# Patient Record
Sex: Male | Born: 1955 | Race: Black or African American | Hispanic: No | Marital: Married | Smoking: Never smoker
Health system: Southern US, Community
[De-identification: ages and names within clinical notes are randomized; demographics above are authoritative.]

## PROBLEM LIST (undated history)

## (undated) DIAGNOSIS — K648 Other hemorrhoids: Secondary | ICD-10-CM

## (undated) DIAGNOSIS — K635 Polyp of colon: Secondary | ICD-10-CM

## (undated) DIAGNOSIS — E785 Hyperlipidemia, unspecified: Secondary | ICD-10-CM

## (undated) DIAGNOSIS — K644 Residual hemorrhoidal skin tags: Secondary | ICD-10-CM

## (undated) DIAGNOSIS — T7840XA Allergy, unspecified, initial encounter: Secondary | ICD-10-CM

## (undated) HISTORY — DX: Allergy, unspecified, initial encounter: T78.40XA

## (undated) HISTORY — PX: BUNIONECTOMY: SHX129

## (undated) HISTORY — DX: Polyp of colon: K63.5

## (undated) HISTORY — DX: Hyperlipidemia, unspecified: E78.5

## (undated) HISTORY — DX: Other hemorrhoids: K64.8

## (undated) HISTORY — DX: Residual hemorrhoidal skin tags: K64.4

## (undated) SURGERY — EXAM UNDER ANESTHESIA WITH HEMORRHOIDECTOMY
Anesthesia: General

---

## 1999-03-22 ENCOUNTER — Ambulatory Visit (HOSPITAL_COMMUNITY): Admission: RE | Admit: 1999-03-22 | Discharge: 1999-03-22 | Payer: Self-pay | Admitting: Gastroenterology

## 2002-04-02 ENCOUNTER — Ambulatory Visit (HOSPITAL_COMMUNITY): Admission: RE | Admit: 2002-04-02 | Discharge: 2002-04-02 | Payer: Self-pay | Admitting: *Deleted

## 2002-04-02 ENCOUNTER — Encounter (INDEPENDENT_AMBULATORY_CARE_PROVIDER_SITE_OTHER): Payer: Self-pay | Admitting: Specialist

## 2004-01-14 ENCOUNTER — Ambulatory Visit (HOSPITAL_COMMUNITY): Admission: RE | Admit: 2004-01-14 | Discharge: 2004-01-14 | Payer: Self-pay | Admitting: Family Medicine

## 2004-05-05 ENCOUNTER — Ambulatory Visit (HOSPITAL_COMMUNITY): Admission: RE | Admit: 2004-05-05 | Discharge: 2004-05-05 | Payer: Self-pay | Admitting: *Deleted

## 2007-11-02 DIAGNOSIS — K635 Polyp of colon: Secondary | ICD-10-CM

## 2007-11-02 HISTORY — DX: Polyp of colon: K63.5

## 2008-09-03 HISTORY — PX: COLONOSCOPY W/ POLYPECTOMY: SHX1380

## 2009-09-30 ENCOUNTER — Encounter: Admission: RE | Admit: 2009-09-30 | Discharge: 2009-09-30 | Payer: Self-pay | Admitting: Emergency Medicine

## 2011-01-18 IMAGING — CT CT CHEST W/ CM
2 of 4 series · 15 of 36 positions shown, 18 images · IV contrast (CONTRAST)
Comparison: None

CLINICAL DATA: Chest pain, left lower rib pain and left flank pain

CT CHEST WITH CONTRAST
TECHNIQUE: Multidetector CT imaging of the chest was performed
following the standard protocol during bolus administration of
intravenous contrast.
Contrast: 80 ml Dmnipaque-ENN

[Series 2: w/ · axial · 0.79mm/px · z∈[+888,+1163]mm · 12 of 66 slices shown, 15 images]
[im 6/66  mediastinal]
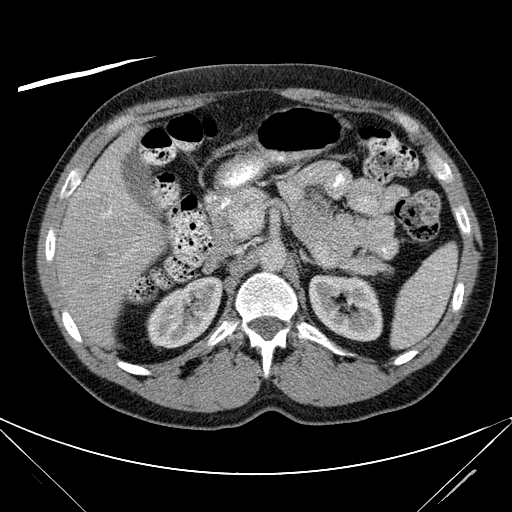
[im 6/66  lung]
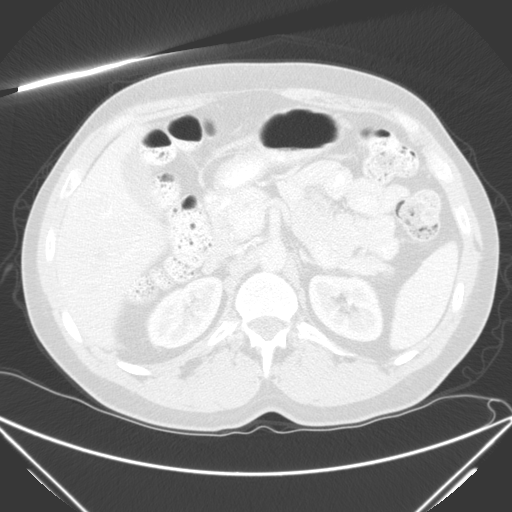
[im 11/66  lung]
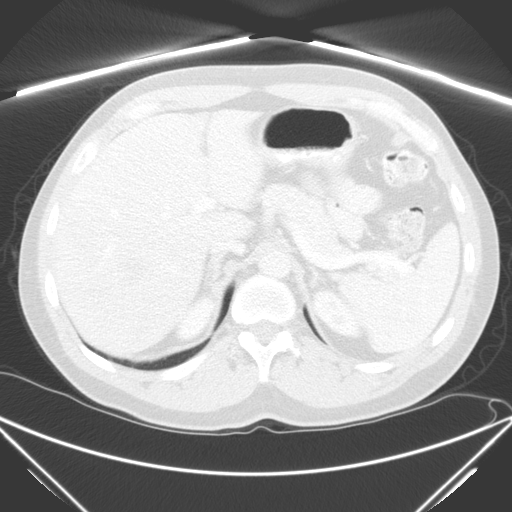
[im 16/66  lung]
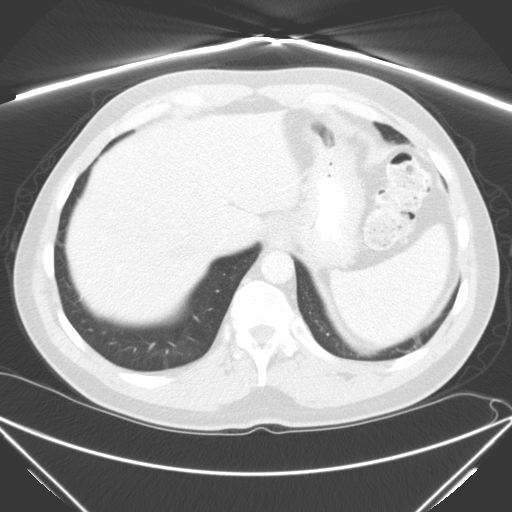
[im 21/66  lung]
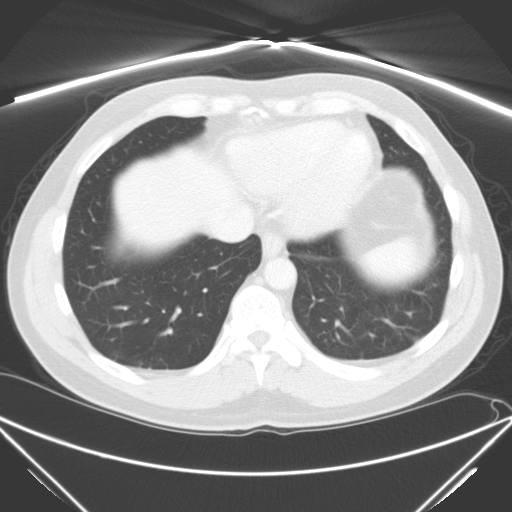
[im 26/66  mediastinal]
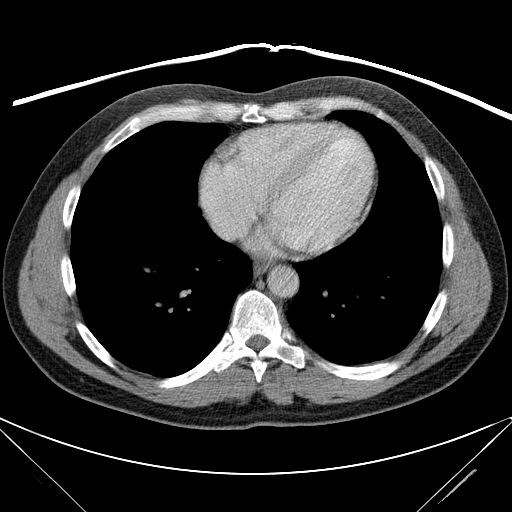
[im 26/66  lung]
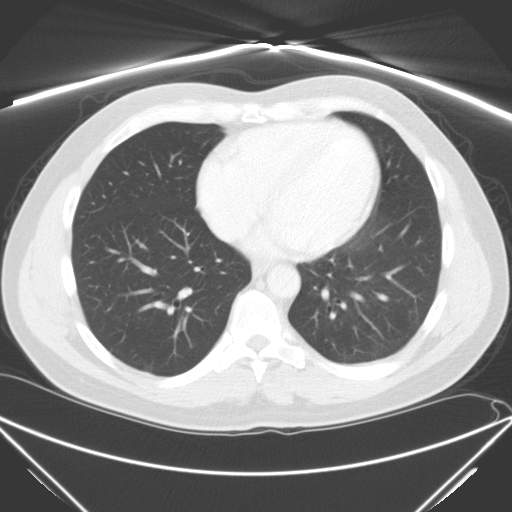
[im 31/66  lung]
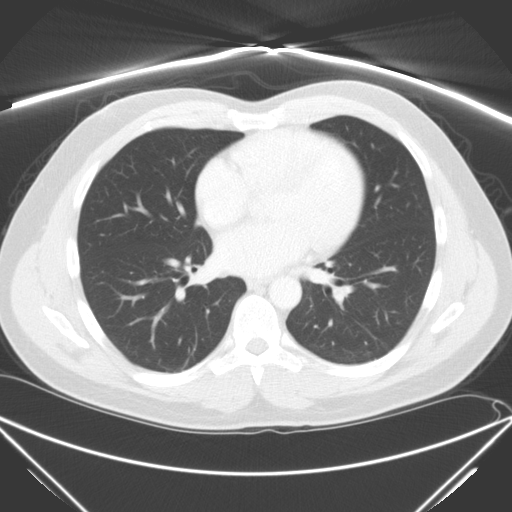
[im 36/66  lung]
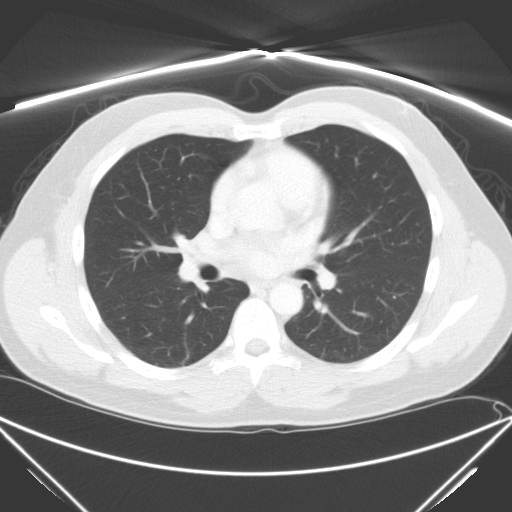
[im 41/66  lung]
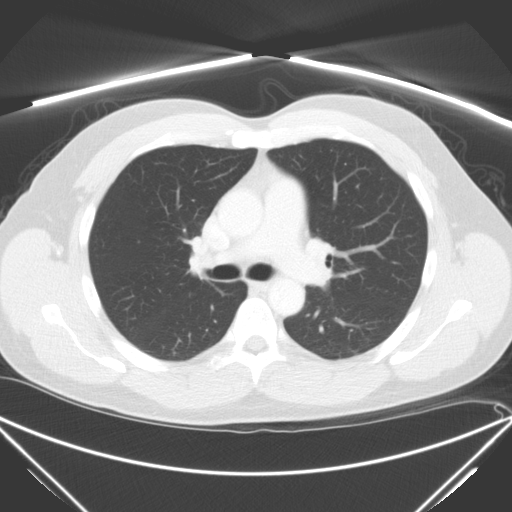
[im 46/66  mediastinal]
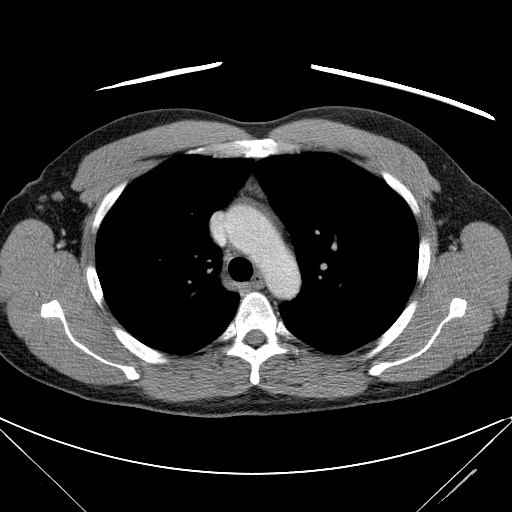
[im 46/66  lung]
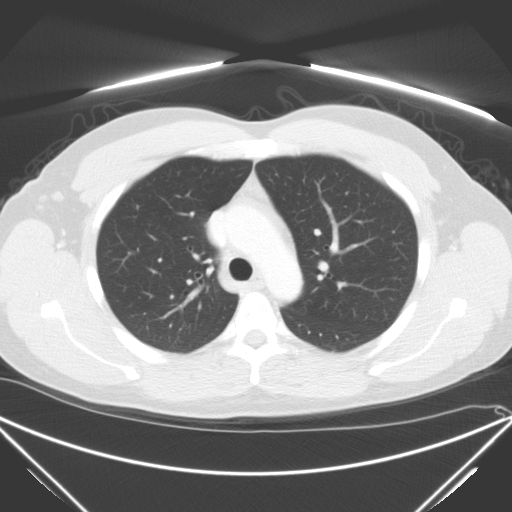
[im 51/66  lung]
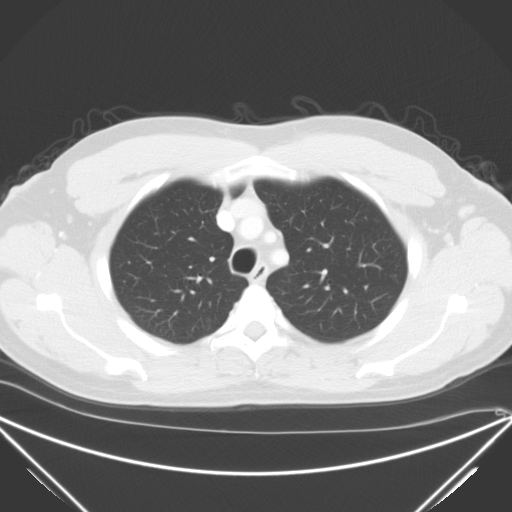
[im 56/66  lung]
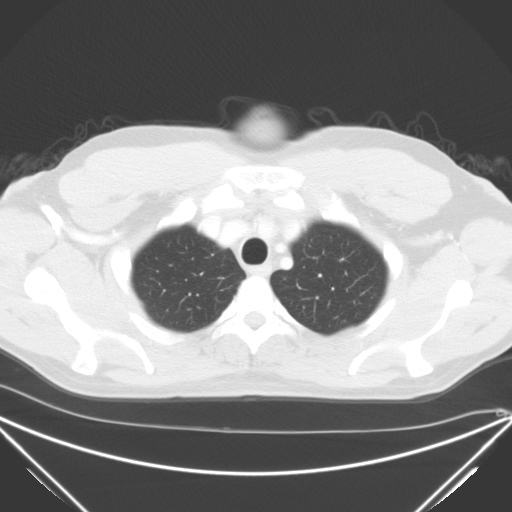
[im 61/66  lung]
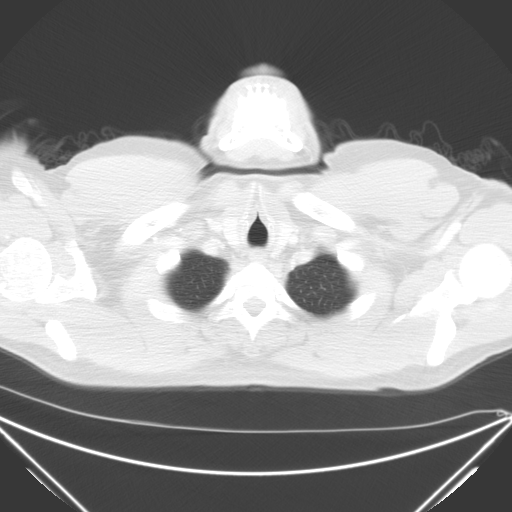

[cor · coronal · 0.79mm/px · 3 of 92 slices shown]
[im 19/92  lung]
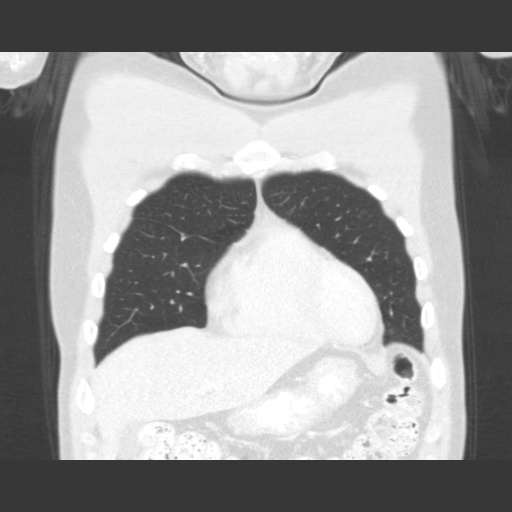
[im 37/92  lung]
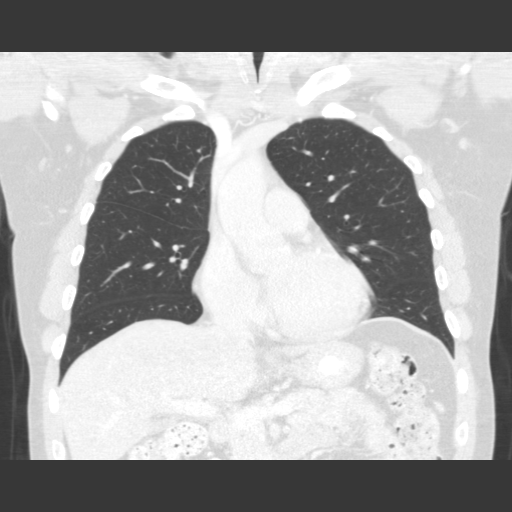
[im 55/92  lung]
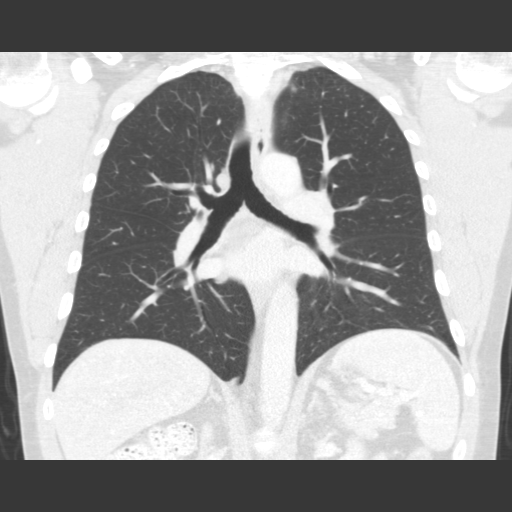

[15 of 36 positions shown; findings below may reference images not displayed]

FINDINGS: There is a poorly defined nodular opacity  of only 3 mm
within the anterior right upper lobe, with two additional 3 mm
noncalcified nodules in the superior segment of the left lower lobe
as well. These nodules are nonspecific and may be due to prior
granulomatous disease.  However an inflammatory process or
metastatic disease is difficult to exclude on the current study and
follow-up CT guidelines are given below. If the patient is at high
risk for bronchogenic carcinoma, follow-up chest CT at 1 year is
recommended.  If the patient is at low risk, no follow-up is
needed.  This recommendation follows the consensus statement:
"Guidelines for Management of Small Pulmonary Nodules Detected on
CT Scans:  A Statement from the [HOSPITAL]" as published in
[URL] No pleural
effusion is seen.

On soft tissue window images, the thyroid gland is normal in size.
The origins of the great vessels are patent.  The pulmonary
arteries and thoracic aorta opacify with no significant abnormality
noted.  No mediastinal or hilar adenopathy is seen.  No abnormality
of the ribs is seen.  The portion of the upper abdomen that is
visualized appears unremarkable.
IMPRESSION: 1.  The only abnormality noted are three noncalcified 3 mm lung
nodules as described above.  These are nonspecific but follow-up
recommendations are given above.
2.  No rib abnormality is seen.
3.  No abnormality is noted within the upper abdomen on the images
obtained.

## 2011-09-23 ENCOUNTER — Ambulatory Visit (INDEPENDENT_AMBULATORY_CARE_PROVIDER_SITE_OTHER): Payer: BC Managed Care – PPO

## 2011-09-23 DIAGNOSIS — E7800 Pure hypercholesterolemia, unspecified: Secondary | ICD-10-CM

## 2011-09-23 DIAGNOSIS — M546 Pain in thoracic spine: Secondary | ICD-10-CM

## 2011-09-23 DIAGNOSIS — K219 Gastro-esophageal reflux disease without esophagitis: Secondary | ICD-10-CM

## 2011-10-26 ENCOUNTER — Telehealth: Payer: Self-pay

## 2011-10-26 DIAGNOSIS — E785 Hyperlipidemia, unspecified: Secondary | ICD-10-CM

## 2011-10-26 NOTE — Telephone Encounter (Signed)
.  [facility] PT STATES HE WAS TO CALL BACK IF HE DECIDED TO START TAKING CHOL MEDICINE AND HE HAVE AGREED TO DO SO.  PLEASE CALL PT AT [phone]  [facility] ON [address] IN [place]

## 2011-10-30 MED ORDER — ATORVASTATIN CALCIUM 10 MG PO TABS: 10.0000 mg | ORAL_TABLET | Freq: Every day | ORAL | Status: DC

## 2011-10-30 NOTE — Telephone Encounter (Signed)
Printed ? Where to send  F/u as planned 03/2012

## 2011-10-30 NOTE — Telephone Encounter (Signed)
Chart pulled to clinical TL 

## 2011-10-30 NOTE — Telephone Encounter (Signed)
lmom to let pt to know that we will sent request to dr. Chart is in dr [employer]

## 2012-02-24 ENCOUNTER — Ambulatory Visit (INDEPENDENT_AMBULATORY_CARE_PROVIDER_SITE_OTHER): Payer: BC Managed Care – PPO | Admitting: Emergency Medicine

## 2012-02-24 VITALS — BP 123/77 | HR 59 | Temp 98.2°F | Resp 16 | Ht 70.0 in | Wt 208.0 lb

## 2012-02-24 DIAGNOSIS — E782 Mixed hyperlipidemia: Secondary | ICD-10-CM

## 2012-02-24 MED ORDER — CELECOXIB 200 MG PO CAPS: 200.0000 mg | ORAL_CAPSULE | Freq: Every morning | ORAL | Status: AC

## 2012-02-24 NOTE — Progress Notes (Signed)
  Subjective:    Patient ID: Steve Graham, male    DOB: 08/26/1956, 56 y.o.   MRN: [id number]  Hyperlipidemia This is a recurrent problem. The current episode started more than 1 year ago. The problem is uncontrolled. He has no history of chronic renal disease, diabetes, hypothyroidism, liver disease, obesity or nephrotic syndrome. There are no known factors aggravating his hyperlipidemia. Associated symptoms include myalgias. Pertinent negatives include no chest pain, focal sensory loss, focal weakness, leg pain or shortness of breath. Current antihyperlipidemic treatment includes exercise and diet change. The current treatment provides no improvement of lipids. There are no compliance problems.  Risk factors for coronary artery disease include male sex, a sedentary lifestyle, dyslipidemia and family history.      Review of Systems  Constitutional: Negative.   HENT: Negative.   Eyes: Negative.   Respiratory: Negative for shortness of breath.   Cardiovascular: Negative for chest pain.  Gastrointestinal: Negative.   Genitourinary: Negative.   Musculoskeletal: Positive for myalgias and arthralgias.  Neurological: Negative.  Negative for focal weakness.  Hematological: Negative.        Objective:   Physical Exam  Constitutional: He is oriented to person, place, and time. He appears well-developed and well-nourished.  HENT:  Head: Normocephalic and atraumatic.  Mouth/Throat: Oropharynx is clear and moist.  Eyes: Conjunctivae are normal. Pupils are equal, round, and reactive to light.  Neck: Normal range of motion. Neck supple.  Cardiovascular: Normal rate and regular rhythm.   Pulmonary/Chest: Effort normal and breath sounds normal.  Abdominal: Soft.  Musculoskeletal: Normal range of motion.  Neurological: He is alert and oriented to person, place, and time.  Skin: Skin is warm and dry.          Assessment & Plan:  Vague joint pains not on cholesterol therapy  Not  fasting Back nest week for labs Start celebrex for jont pains

## 2012-02-29 ENCOUNTER — Ambulatory Visit (INDEPENDENT_AMBULATORY_CARE_PROVIDER_SITE_OTHER): Payer: BC Managed Care – PPO | Admitting: Emergency Medicine

## 2012-02-29 VITALS — BP 132/79 | HR 59 | Temp 98.2°F | Resp 16 | Ht 70.0 in | Wt 208.6 lb

## 2012-02-29 DIAGNOSIS — E782 Mixed hyperlipidemia: Secondary | ICD-10-CM

## 2012-02-29 NOTE — Progress Notes (Signed)
  Subjective:    Patient ID: Steve Graham, male    DOB: 09-Mar-1956, 56 y.o.   MRN: [id number]  Hyperlipidemia This is a recurrent problem. The current episode started more than 1 year ago. The problem is uncontrolled. Recent lipid tests were reviewed and are high. He has no history of chronic renal disease, diabetes, hypothyroidism, liver disease, obesity or nephrotic syndrome. There are no known factors aggravating his hyperlipidemia. Pertinent negatives include no chest pain, focal sensory loss, focal weakness, leg pain, myalgias or shortness of breath. Current antihyperlipidemic treatment includes diet change. The current treatment provides no improvement of lipids. Compliance problems include adherence to diet.  Risk factors for coronary artery disease include dyslipidemia and male sex.      Review of Systems  Constitutional: Negative.   HENT: Negative.   Eyes: Negative.   Respiratory: Negative.  Negative for shortness of breath.   Cardiovascular: Negative for chest pain.  Gastrointestinal: Negative.   Genitourinary: Negative.   Musculoskeletal: Negative.  Negative for myalgias.  Neurological: Negative for focal weakness.       Objective:   Physical Exam  Constitutional: He is oriented to person, place, and time. He appears well-developed and well-nourished.  HENT:  Head: Normocephalic and atraumatic.  Eyes: Conjunctivae are normal. Pupils are equal, round, and reactive to light.  Neck: Normal range of motion.  Cardiovascular: Normal rate, regular rhythm and normal heart sounds.   Pulmonary/Chest: Effort normal and breath sounds normal.  Abdominal: Soft.  Musculoskeletal: Normal range of motion.  Neurological: He is alert and oriented to person, place, and time.  Skin: Skin is warm and dry.          Assessment & Plan:  Here for labs off medications

## 2012-03-01 LAB — COMPREHENSIVE METABOLIC PANEL
ALT: 44 U/L (ref 0–53)
AST: 27 U/L (ref 0–37)
CO2: 25 mEq/L (ref 19–32)
Calcium: 9.3 mg/dL (ref 8.4–10.5)
Glucose, Bld: 86 mg/dL (ref 70–99)
Potassium: 4.6 mEq/L (ref 3.5–5.3)
Sodium: 140 mEq/L (ref 135–145)

## 2012-03-01 LAB — LIPID PANEL
Cholesterol: 205 mg/dL — ABNORMAL HIGH (ref 0–200)
HDL: 44 mg/dL (ref >39)
LDL Cholesterol: 151 mg/dL — ABNORMAL HIGH (ref 0–99)
Triglycerides: 52 mg/dL (ref <150)
VLDL: 10 mg/dL (ref 0–40)

## 2012-03-01 LAB — VITAMIN D 25 HYDROXY (VIT D DEFICIENCY, FRACTURES): Vit D, 25-Hydroxy: 29 ng/mL — ABNORMAL LOW (ref 30–89)

## 2012-03-01 LAB — TESTOSTERONE: Testosterone: 628.95 ng/dL (ref 300–890)

## 2012-03-05 ENCOUNTER — Encounter: Payer: Self-pay | Admitting: Emergency Medicine

## 2012-03-05 ENCOUNTER — Other Ambulatory Visit: Payer: Self-pay | Admitting: Emergency Medicine

## 2012-03-05 DIAGNOSIS — E785 Hyperlipidemia, unspecified: Secondary | ICD-10-CM

## 2012-03-05 MED ORDER — VITAMIN D (ERGOCALCIFEROL) 1.25 MG (50000 UNIT) PO CAPS: 50000.0000 [IU] | ORAL_CAPSULE | ORAL | Status: DC

## 2012-03-05 MED ORDER — ATORVASTATIN CALCIUM 10 MG PO TABS: 10.0000 mg | ORAL_TABLET | Freq: Every day | ORAL | Status: DC

## 2012-09-07 ENCOUNTER — Ambulatory Visit (INDEPENDENT_AMBULATORY_CARE_PROVIDER_SITE_OTHER): Payer: BC Managed Care – PPO | Admitting: Family Medicine

## 2012-09-07 VITALS — BP 115/70 | HR 80 | Temp 98.9°F | Resp 18 | Wt 212.0 lb

## 2012-09-07 DIAGNOSIS — Z0000 Encounter for general adult medical examination without abnormal findings: Secondary | ICD-10-CM

## 2012-09-07 DIAGNOSIS — Z23 Encounter for immunization: Secondary | ICD-10-CM

## 2012-09-07 LAB — COMPREHENSIVE METABOLIC PANEL
ALT: 46 U/L (ref 0–53)
AST: 21 U/L (ref 0–37)
Albumin: 4.6 g/dL (ref 3.5–5.2)
Alkaline Phosphatase: 50 U/L (ref 39–117)
CO2: 28 mEq/L (ref 19–32)
Glucose, Bld: 81 mg/dL (ref 70–99)
Potassium: 4.1 mEq/L (ref 3.5–5.3)
Sodium: 135 mEq/L (ref 135–145)

## 2012-09-07 LAB — LIPID PANEL
HDL: 46 mg/dL (ref >39)
LDL Cholesterol: 110 mg/dL — ABNORMAL HIGH (ref 0–99)
Total CHOL/HDL Ratio: 3.7 Ratio
Triglycerides: 65 mg/dL (ref <150)

## 2012-09-07 LAB — POCT UA - MICROSCOPIC ONLY
Casts, Ur, LPF, POC: NEGATIVE
Epithelial cells, urine per micros: NEGATIVE
Mucus, UA: NEGATIVE
Yeast, UA: NEGATIVE

## 2012-09-07 LAB — VITAMIN B12: Vitamin B-12: 1094 pg/mL — ABNORMAL HIGH (ref 211–911)

## 2012-09-07 LAB — POCT URINALYSIS DIPSTICK
Bilirubin, UA: NEGATIVE
Glucose, UA: NEGATIVE
Spec Grav, UA: >=1.03
pH, UA: 6

## 2012-09-07 LAB — POCT CBC
Granulocyte percent: 61.1 %G (ref 37–80)
HCT, POC: 45.2 % (ref 43.5–53.7)
Hemoglobin: 14.2 g/dL (ref 14.1–18.1)
MCV: 93.3 fL (ref 80–97)
POC LYMPH PERCENT: 32.4 %L (ref 10–50)
RBC: 4.84 M/uL (ref 4.69–6.13)

## 2012-09-07 LAB — TSH: TSH: 1.969 u[IU]/mL (ref 0.350–4.500)

## 2012-09-07 NOTE — Patient Instructions (Addendum)
1. Routine general medical examination at a health care facility  Comprehensive metabolic panel, Lipid panel, EKG 12-Lead, POCT CBC, POCT glycosylated hemoglobin (Hb A1C), Vitamin B12, TSH, POCT UA - Microscopic Only, POCT urinalysis dipstick, PSA, Vitamin D 25 hydroxy     RECOMMEND STARTING ASPIRIN 81 MG ONCE DAILY FOR HEART ATTACK AND STROKE PREVENTION.

## 2012-09-07 NOTE — Progress Notes (Signed)
[address]   [place], [place]  [address]   [phone]  Subjective:    Patient ID: Steve Graham, male    DOB: 1956/01/24, 57 y.o.   MRN: [id number]  HPIThis 57 y.o. male presents for evaluation for CPE.    Last physical 09/2011.   Tetanus vaccine 2004. Flu vaccines never.  Colonoscopy 2010; +polyps; repeat in 5 years. Eye exam 08/2012; no glaucoma or cataracts.  [facility] exam: four months ago.   Review of Systems  Constitutional: Negative for fever, chills, diaphoresis, activity change, appetite change, fatigue and unexpected weight change.  HENT: Negative for hearing loss, ear pain, nosebleeds, congestion, sore throat, facial swelling, rhinorrhea, sneezing, drooling, mouth sores, trouble swallowing, neck pain, neck stiffness, dental problem, voice change, postnasal drip, sinus pressure, tinnitus and ear discharge.   Eyes: Positive for discharge. Negative for photophobia, pain, redness, itching and visual disturbance.  Respiratory: Negative for apnea, cough, choking, chest tightness, shortness of breath, wheezing and stridor.   Cardiovascular: Negative for chest pain, palpitations and leg swelling.  Gastrointestinal: Negative for nausea, vomiting, abdominal pain, diarrhea, constipation, blood in stool, abdominal distention, anal bleeding and rectal pain.  Genitourinary: Negative for dysuria, urgency, frequency, hematuria, flank pain, decreased urine volume, discharge, penile swelling, scrotal swelling, enuresis, difficulty urinating, genital sores, penile pain and testicular pain.  Musculoskeletal: Positive for back pain. Negative for myalgias, joint swelling, arthralgias and gait problem.  Skin: Negative for color change, pallor, rash and wound.  Neurological: Negative for dizziness, tremors, seizures, syncope, facial asymmetry, speech difficulty, weakness, light-headedness, numbness and headaches.  Hematological: Negative for adenopathy. Does not bruise/bleed easily.    Psychiatric/Behavioral: Negative for suicidal ideas, hallucinations, behavioral problems, confusion, sleep disturbance, self-injury, dysphoric mood, decreased concentration and agitation. The patient is not nervous/anxious and is not hyperactive.         Past Medical History  Diagnosis Date  . Allergy   . Hyperlipidemia     Past Surgical History  Procedure Date  . Bunionectomy   . Colonoscopy w/ polypectomy 09/03/2008    colon polyps. repeat in 5 years. Nat Mann    Prior to Admission medications   Medication Sig Start Date End Date Taking? Authorizing Provider  aspirin 81 MG tablet Take 81 mg by mouth daily.   Yes Historical Provider, MD  atorvastatin (LIPITOR) 10 MG tablet Take 1 tablet (10 mg total) by mouth daily. 03/05/12 03/05/13 Yes Phillips Odor, MD  co-enzyme Q-10 30 MG capsule Take 30 mg by mouth 3 (three) times daily.   Yes Historical Provider, MD  cycloSPORINE (RESTASIS) 0.05 % ophthalmic emulsion 1 drop 2 (two) times daily.   Yes Historical Provider, MD  Multiple Vitamins-Minerals (MULTIVITAMIN WITH MINERALS) tablet Take 1 tablet by mouth daily.   Yes Historical Provider, MD  Vitamin D, Ergocalciferol, (DRISDOL) 50000 UNITS CAPS Take 1 capsule (50,000 Units total) by mouth every 7 (seven) days. 03/05/12  Yes Phillips Odor, MD    No Known Allergies  History   Social History  . Marital Status: Married    Spouse Name: N/A    Number of Children: N/A  . Years of Education: N/A   Occupational History  . Not on file.   Social History Main Topics  . Smoking status: Never Smoker   . Smokeless tobacco: Not on file  . Alcohol Use: Yes     Comment: 2 drinks per week  . Drug Use: No  . Sexually Active: Yes   Other Topics Concern  . Not on  file   Social History Narrative   Marital status: married x 23 years; happily married   Children:  2 boys; no grandchildren.   Lives: wife, 2 boys.   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     Education: college   Tobacco:  never   Alcohol: two drinks per week.   Drugs: none   Exercises:  Two times per week for 30 minutes running, weight lifting.  Seatbelt:  100%   Guns: none    Family History  Problem Relation Age of Onset  . Diabetes Mother   . Hypertension Mother   . Asthma Son    Ceasar Mons Vitals:   09/07/12 0933  BP: 115/70  Pulse: 80  Temp: 98.9 F (37.2 C)  Resp: 18   Objective:   Physical Exam  Nursing note and vitals reviewed. Constitutional: He is oriented to person, place, and time. He appears well-developed and well-nourished. No distress.  HENT:  Head: Normocephalic and atraumatic.  Right Ear: External ear normal.  Left Ear: External ear normal.  Nose: Nose normal.  Mouth/Throat: Oropharynx is clear and moist.  Eyes: Conjunctivae normal and EOM are normal. Pupils are equal, round, and reactive to light.  Neck: Normal range of motion. Neck supple. No JVD present. No thyromegaly present.  Cardiovascular: Normal rate, regular rhythm, normal heart sounds and intact distal pulses.  Exam reveals no gallop and no friction rub.   No murmur heard. Pulmonary/Chest: Effort normal and breath sounds normal. He has no wheezes. He has no rales.  Abdominal: Soft. Bowel sounds are normal. He exhibits no distension and no mass. There is no tenderness. There is no rebound and no guarding. Hernia confirmed negative in the right inguinal area and confirmed negative in the left inguinal area.  Genitourinary: Prostate normal, testes normal and penis normal. Rectal exam shows external hemorrhoid. Rectal exam shows no fissure, no mass, no tenderness and anal tone normal. Right testis shows no mass, no swelling and no tenderness. Left testis shows no mass, no swelling and no tenderness. No penile tenderness.  Musculoskeletal:       Right shoulder: Normal.       Left shoulder: Normal.       Cervical back: Normal.       Thoracic back: Normal.       Lumbar back: Normal.  Lymphadenopathy:    He has no cervical  adenopathy.       Right: No inguinal adenopathy present.       Left: No inguinal adenopathy present.  Neurological: He is alert and oriented to person, place, and time. He has normal reflexes. No cranial nerve deficit. He exhibits normal muscle tone. Coordination normal.  Skin: Skin is warm and dry. No rash noted. He is not diaphoretic.  Psychiatric: He has a normal mood and affect. His behavior is normal. Judgment and thought content normal.      Results for orders placed in visit on 09/07/12  POCT CBC      Component Value Range   WBC 5.6  4.6 - 10.2 K/uL   Lymph, poc 1.8  0.6 - 3.4   POC LYMPH PERCENT 32.4  10 - 50 %L   MID (cbc) 0.4  0 - 0.9   POC MID % 6.5  0 - 12 %M   POC Granulocyte 3.4  2 - 6.9   Granulocyte percent 61.1  37 - 80 %G   RBC 4.84  4.69 - 6.13 M/uL   Hemoglobin 14.2  14.1 - 18.1  g/dL   HCT, POC 40.9  81.1 - 53.7 %   MCV 93.3  80 - 97 fL   MCH, POC 29.3  27 - 31.2 pg   MCHC 31.4 (*) 31.8 - 35.4 g/dL   RDW, POC 91.4     Platelet Count, POC 235  142 - 424 K/uL   MPV 9.4  0 - 99.8 fL  POCT GLYCOSYLATED HEMOGLOBIN (HGB A1C)      Component Value Range   Hemoglobin A1C 5.4    POCT UA - MICROSCOPIC ONLY      Component Value Range   WBC, Ur, HPF, POC 0-1     RBC, urine, microscopic 0-3     Bacteria, U Microscopic neg     Mucus, UA neg     Epithelial cells, urine per micros neg     Crystals, Ur, HPF, POC neg     Casts, Ur, LPF, POC neg     Yeast, UA neg    POCT URINALYSIS DIPSTICK      Component Value Range   Color, UA yellow     Clarity, UA clear     Glucose, UA neg     Bilirubin, UA neg     Ketones, UA neg     Spec Grav, UA >=1.030     Blood, UA trace     pH, UA 6.0     Protein, UA neg     Urobilinogen, UA 0.2     Nitrite, UA neg     Leukocytes, UA Negative     i Assessment & Plan:   1. Routine general medical examination at a health care facility  Comprehensive metabolic panel, Lipid panel, EKG 12-Lead, POCT CBC, POCT glycosylated hemoglobin (Hb  A1C), Vitamin B12, TSH, POCT UA - Microscopic Only, POCT urinalysis dipstick, PSA, Vitamin D 25 hydroxy    1.  CPE:  Anticipatory guidance --- weight maintenance, exercise, start ASA 81mg  daily.  Immunizations discussed; declined influenza vaccine; s/p TDAP.  Colonoscopy UTD.  Obtain labs.

## 2012-09-08 LAB — VITAMIN D 25 HYDROXY (VIT D DEFICIENCY, FRACTURES): Vit D, 25-Hydroxy: 44 ng/mL (ref 30–89)

## 2012-10-26 ENCOUNTER — Ambulatory Visit (INDEPENDENT_AMBULATORY_CARE_PROVIDER_SITE_OTHER): Payer: BC Managed Care – PPO | Admitting: Family Medicine

## 2012-10-26 VITALS — BP 142/72 | HR 88 | Temp 98.5°F | Resp 17 | Ht 70.0 in | Wt 211.0 lb

## 2012-10-26 DIAGNOSIS — K644 Residual hemorrhoidal skin tags: Secondary | ICD-10-CM

## 2012-10-26 LAB — POCT CBC
Granulocyte percent: 53 %G (ref 37–80)
HCT, POC: 38.6 % — AB (ref 43.5–53.7)
MCV: 93 fL (ref 80–97)
MID (cbc): 0.3 (ref 0–0.9)
POC Granulocyte: 2.5 (ref 2–6.9)
POC LYMPH PERCENT: 40.5 %L (ref 10–50)
Platelet Count, POC: 200 10*3/uL (ref 142–424)
RBC: 4.15 M/uL — AB (ref 4.69–6.13)
RDW, POC: 16.7 %

## 2012-10-26 MED ORDER — HYDROCORTISONE ACE-PRAMOXINE 2.5-1 % RE CREA: TOPICAL_CREAM | Freq: Four times a day (QID) | RECTAL | Status: DC

## 2012-10-26 NOTE — Patient Instructions (Signed)

## 2012-10-26 NOTE — Progress Notes (Signed)
Subjective:    Patient ID: Steve Graham, male    DOB: 07-01-1956, 57 y.o.   MRN: [id number] Chief Complaint  Patient presents with  . Hemorrhoids    bleeding     HPI  Has a h/o hemorroids and usually go away after a wk or so but this one has been flaired for about 2 wks and starts bleeding and he will have to pack self with tissue and can get it to stop after about 30 min.  Has been using preparation H, a tucs spray, and a new preparation type ointment.  Showers - no baths.  Bleeding is bright red. No constipation - takes gummer fiber pills so stools are soft and has been taking philips colon health.  Hemorrhoid is somewhat painful.  Is external.  Did have to have surgery once about 10 yrs ago.    Past Medical History  Diagnosis Date  . Allergy   . Hyperlipidemia   . Hemorrhoids, external   . Hemorrhoids, internal   . Colon polyp 11/02/2007    sessile polyp, external hemorrhoids.  Repeat in 5-10 years. Hung.   Current Outpatient Prescriptions on File Prior to Visit  Medication Sig Dispense Refill  . aspirin 81 MG tablet Take 81 mg by mouth daily.      Marland Kitchen atorvastatin (LIPITOR) 10 MG tablet Take 1 tablet (10 mg total) by mouth daily.  90 tablet  3  . cycloSPORINE (RESTASIS) 0.05 % ophthalmic emulsion 1 drop 2 (two) times daily.      . Multiple Vitamins-Minerals (MULTIVITAMIN WITH MINERALS) tablet Take 1 tablet by mouth daily.      . Vitamin D, Ergocalciferol, (DRISDOL) 50000 UNITS CAPS Take 1 capsule (50,000 Units total) by mouth every 7 (seven) days.  12 capsule  3  . co-enzyme Q-10 30 MG capsule Take 30 mg by mouth 3 (three) times daily.       No current facility-administered medications on file prior to visit.   No Known Allergies  Review of Systems  Constitutional: Positive for appetite change. Negative for fever, chills, diaphoresis and activity change.  Respiratory: Negative for shortness of breath.   Cardiovascular: Negative for chest pain.  Gastrointestinal: Positive for anal  bleeding and rectal pain. Negative for nausea, vomiting, abdominal pain, diarrhea, constipation, blood in stool and abdominal distention.  Genitourinary: Negative for dysuria, frequency and decreased urine volume.  Hematological: Negative for adenopathy. Does not bruise/bleed easily.      BP 142/72  Pulse 88  Temp(Src) 98.5 F (36.9 C) (Oral)  Resp 17  Ht 5\' 10"  (1.778 m)  Wt 211 lb (95.709 kg)  BMI 30.28 kg/m2  SpO2 96% Objective:   Physical Exam  Constitutional: He is oriented to person, place, and time. He appears well-developed and well-nourished. No distress.  HENT:  Head: Normocephalic and atraumatic.  Eyes: Conjunctivae are normal. Pupils are equal, round, and reactive to light. No scleral icterus.  Neck: Normal range of motion. Neck supple. No thyromegaly present.  Cardiovascular: Normal rate, regular rhythm, normal heart sounds and intact distal pulses.   Pulmonary/Chest: Effort normal and breath sounds normal. No respiratory distress.  Genitourinary: Rectal exam shows external hemorrhoid and tenderness. Rectal exam shows no fissure and anal tone normal.  Pt had 3 large external hemorrhoids - the largest had a small area of hemostatic excoriation approx 1/2cm dm on it's internal surface.  All soft and nontender, pink.  No thrombosis.  Musculoskeletal: He exhibits no edema.  Lymphadenopathy:    He has  no cervical adenopathy.  Neurological: He is alert and oriented to person, place, and time.  Skin: Skin is warm and dry. He is not diaphoretic.  Psychiatric: He has a normal mood and affect. His behavior is normal.       Results for orders placed in visit on 10/26/12  POCT CBC      Result Value Range   WBC 4.8  4.6 - 10.2 K/uL   Lymph, poc 1.9  0.6 - 3.4   POC LYMPH PERCENT 40.5  10 - 50 %L   MID (cbc) 0.3  0 - 0.9   POC MID % 6.5  0 - 12 %M   POC Granulocyte 2.5  2 - 6.9   Granulocyte percent 53.0  37 - 80 %G   RBC 4.15 (*) 4.69 - 6.13 M/uL   Hemoglobin 12.3 (*) 14.1  - 18.1 g/dL   HCT, POC 46.9 (*) 62.9 - 53.7 %   MCV 93.0  80 - 97 fL   MCH, POC 29.6  27 - 31.2 pg   MCHC 31.9  31.8 - 35.4 g/dL   RDW, POC 52.8     Platelet Count, POC 200  142 - 424 K/uL   MPV 7.9  0 - 99.8 fL    Assessment & Plan:  Hemorrhoids, external, with complication - Plan: Ambulatory referral to Gastroenterology, POCT CBC Since pt has at least 3 to 4 very large external hemorrhoids, I am going to refer him to GI for definitive treatment.  Fortunately, none currently appear to be thrombosed so will not treat in the office today as there is nothing urgent and I think this would cause a great amount of pain and open wounds. Pt agreeable to plan. Pt has become newly anemic from the bleeding hemorrhoid in the past mo. Meds ordered this encounter  Medications  . hydrocortisone-pramoxine (ANALPRAM-HC) 2.5-1 % rectal cream    Sig: Place rectally 4 (four) times daily.    Dispense:  30 g    Refill:  1

## 2013-01-27 ENCOUNTER — Encounter (INDEPENDENT_AMBULATORY_CARE_PROVIDER_SITE_OTHER): Payer: Self-pay | Admitting: General Surgery

## 2013-01-27 ENCOUNTER — Ambulatory Visit (INDEPENDENT_AMBULATORY_CARE_PROVIDER_SITE_OTHER): Payer: BC Managed Care – PPO | Admitting: General Surgery

## 2013-01-27 ENCOUNTER — Other Ambulatory Visit: Payer: Self-pay | Admitting: Emergency Medicine

## 2013-01-27 VITALS — BP 122/86 | HR 64 | Temp 97.9°F | Resp 16 | Ht 71.0 in | Wt 208.0 lb

## 2013-01-27 DIAGNOSIS — K648 Other hemorrhoids: Secondary | ICD-10-CM

## 2013-01-27 DIAGNOSIS — K644 Residual hemorrhoidal skin tags: Secondary | ICD-10-CM

## 2013-01-27 MED ORDER — HYDROCORTISONE ACETATE 25 MG RE SUPP: 25.0000 mg | Freq: Two times a day (BID) | RECTAL | Status: DC

## 2013-01-27 NOTE — Progress Notes (Signed)
The patient comes in with a 2 to three-month history of fluctuating in severity hemorrhoids. 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 The only surgery that his head has been the incision and drainage of possibly a thrombosed internal hemorrhoid 9 years ago.  Approximately one month ago he had a colonoscopy where was noted that the patient had significant internal hemorrhoids. His most significant problems or pain and bleeding. He currently rates his hemorrhoidal disease as being a 6/10. However he has not had any bleeding over the last 3 weeks. His stools other, more regular with the use of Colace to help soften his bowel movements. He has a prescribed anal rectal cream that he uses in order to decrease the severity of his disease.  His past medical history significant only for hypercholesterolemia. He also has dry eyes for which she takes Restasis.  He has no known drug allergies.  On anoscopic exam the patient was notable for having a large external hemorrhoid at the 1:00 position. On anoscopic view the patient had significant internal hemorrhoidal complexes at the 1:00 2:00 3:00 6:00 and 9:00 positions. There was no bleeding during the examination although the internal hemorrhoids appeared to be significantly friable. There was no prolapse of the hemorrhoids making them a grade 2-3 disease.  Although the patient's symptoms are not very severe in the burden of this disease is such that I do not believe these will go away completely with just medical management. I will write to try him on 2 weeks worth of Anusol [facility] Suppository treatment and reassess him for the possibility of an examination under anesthesia with an internal excellent hemorrhoidectomy. The patient has 3 column disease and at least a 2 column hemorrhoidectomy would be performed at the time. This can be done in the lithotomy position.  The current treatment will be 2 weeks worth of Anusol [facility] Suppository is followed by reexamination. At  that time will be decided if surgical treatment would be most appropriate.

## 2013-01-30 ENCOUNTER — Encounter (INDEPENDENT_AMBULATORY_CARE_PROVIDER_SITE_OTHER): Payer: Self-pay

## 2013-02-11 ENCOUNTER — Encounter (INDEPENDENT_AMBULATORY_CARE_PROVIDER_SITE_OTHER): Payer: Self-pay | Admitting: General Surgery

## 2013-02-11 ENCOUNTER — Ambulatory Visit (INDEPENDENT_AMBULATORY_CARE_PROVIDER_SITE_OTHER): Payer: BC Managed Care – PPO | Admitting: General Surgery

## 2013-02-11 VITALS — BP 120/74 | HR 82 | Temp 98.3°F | Resp 16 | Ht 71.0 in | Wt 212.0 lb

## 2013-02-11 DIAGNOSIS — K648 Other hemorrhoids: Secondary | ICD-10-CM

## 2013-02-11 DIAGNOSIS — K644 Residual hemorrhoidal skin tags: Secondary | ICD-10-CM

## 2013-02-11 NOTE — Progress Notes (Signed)
The patient has been minimally symptomatic since his last visit. He feels as though the suppositories may have helped with his symptoms, having no bleeding. He did have some perianal cream in place indicating that he is having more symptoms that he opens up to.  I did a digital examination where I could palpate internal and external hemorrhoids. On anoscopy the patient has hemorrhoidal disease as described before. None of it was friable or bleeding.  I discussed with the patient the option of observation versus an examination under anesthesia with at least a 2 column hemorrhoidectomy and a rubber band ligation of any other hemorrhoidal disease that is in place. I would not do 3 columns at any time because of the possibility of sphincter injury or possible stricturing.  The patient was to go ahead with the examination under anesthesia and hemorrhoidectomy. We will go ahead and get this scheduled.

## 2013-03-17 ENCOUNTER — Other Ambulatory Visit: Payer: Self-pay | Admitting: Physician Assistant

## 2013-03-17 ENCOUNTER — Other Ambulatory Visit: Payer: Self-pay | Admitting: Emergency Medicine

## 2013-07-09 ENCOUNTER — Other Ambulatory Visit: Payer: Self-pay

## 2013-08-03 ENCOUNTER — Other Ambulatory Visit: Payer: Self-pay | Admitting: Family Medicine

## 2013-11-07 ENCOUNTER — Other Ambulatory Visit: Payer: Self-pay | Admitting: Family Medicine

## 2013-11-07 ENCOUNTER — Ambulatory Visit (INDEPENDENT_AMBULATORY_CARE_PROVIDER_SITE_OTHER): Payer: BC Managed Care – PPO | Admitting: Family Medicine

## 2013-11-07 ENCOUNTER — Ambulatory Visit: Payer: BC Managed Care – PPO

## 2013-11-07 VITALS — BP 132/84 | HR 82 | Temp 98.9°F | Resp 17 | Ht 70.0 in | Wt 218.0 lb

## 2013-11-07 DIAGNOSIS — R0789 Other chest pain: Secondary | ICD-10-CM

## 2013-11-07 DIAGNOSIS — Z862 Personal history of diseases of the blood and blood-forming organs and certain disorders involving the immune mechanism: Secondary | ICD-10-CM

## 2013-11-07 DIAGNOSIS — Z0000 Encounter for general adult medical examination without abnormal findings: Secondary | ICD-10-CM

## 2013-11-07 DIAGNOSIS — Z8639 Personal history of other endocrine, nutritional and metabolic disease: Secondary | ICD-10-CM

## 2013-11-07 DIAGNOSIS — Z125 Encounter for screening for malignant neoplasm of prostate: Secondary | ICD-10-CM

## 2013-11-07 LAB — COMPREHENSIVE METABOLIC PANEL
ALBUMIN: 4.2 g/dL (ref 3.5–5.2)
ALK PHOS: 43 U/L (ref 39–117)
ALT: 52 U/L (ref 0–53)
AST: 27 U/L (ref 0–37)
BUN: 15 mg/dL (ref 6–23)
CHLORIDE: 101 meq/L (ref 96–112)
CO2: 26 mEq/L (ref 19–32)
Calcium: 9.1 mg/dL (ref 8.4–10.5)
Creat: 1.03 mg/dL (ref 0.50–1.35)
GLUCOSE: 81 mg/dL (ref 70–99)
POTASSIUM: 4.4 meq/L (ref 3.5–5.3)
Sodium: 138 mEq/L (ref 135–145)
TOTAL PROTEIN: 6.7 g/dL (ref 6.0–8.3)
Total Bilirubin: 1.4 mg/dL — ABNORMAL HIGH (ref 0.2–1.2)

## 2013-11-07 LAB — CBC
HCT: 38.8 % — ABNORMAL LOW (ref 39.0–52.0)
Hemoglobin: 13.2 g/dL (ref 13.0–17.0)
MCH: 30.3 pg (ref 26.0–34.0)
MCHC: 34 g/dL (ref 30.0–36.0)
MCV: 89 fL (ref 78.0–100.0)
Platelets: 222 10*3/uL (ref 150–400)
RBC: 4.36 MIL/uL (ref 4.22–5.81)
RDW: 15.5 % (ref 11.5–15.5)
WBC: 5.5 10*3/uL (ref 4.0–10.5)

## 2013-11-07 LAB — LIPID PANEL
Cholesterol: 215 mg/dL — ABNORMAL HIGH (ref 0–200)
HDL: 39 mg/dL — ABNORMAL LOW (ref >39)
LDL Cholesterol: 159 mg/dL — ABNORMAL HIGH (ref 0–99)
TRIGLYCERIDES: 87 mg/dL (ref <150)
Total CHOL/HDL Ratio: 5.5 Ratio
VLDL: 17 mg/dL (ref 0–40)

## 2013-11-07 MED ORDER — OMEPRAZOLE 20 MG PO CPDR: 20.0000 mg | DELAYED_RELEASE_CAPSULE | Freq: Every day | ORAL | Status: DC

## 2013-11-07 MED ORDER — SUCRALFATE 1 G PO TABS: 1.0000 g | ORAL_TABLET | Freq: Three times a day (TID) | ORAL | Status: DC

## 2013-11-07 NOTE — Patient Instructions (Signed)
We are going to treat you for heartburn/ GERD with carafate (take before meals and before bed for about 10 days) and prilosec (take once a day).    IF your chest discomfort does not completely resolve, or if it gets worse please do go to the ER for further evaluation   I will be in touch with the rest of your labs.

## 2013-11-07 NOTE — Progress Notes (Signed)
[facility] [address], [place] [id number] [phone]  Date:  11/07/2013   Name:  Jaci StandardVental Mcartor   DOB:  07/21/1956   MRN:  [id number]  PCP:  No primary provider on file.    Chief Complaint: Annual Exam and burning in chest   History of Present Illness:  Celeste Greer PickerelMcKoy is a 58 y.o. very pleasant male patient who presents with the following:  He is here today for a CPE and also has a complaint of "burning" in his chest.   He also notes this burning in his back- he has noted it since last night.  It is still there now but is now better.  He noted this first around midnight last night when he was resting in bed.   He feels like this might be heartburn- he also notes a burning in the left upper quadrant.   He has been told he had acid reflux several years ago.  No OTC medications used recently  No SOB.  No cough or fever.   He has a history of high cholesterol- he stopped his lipitor because he wanted to see how he did without it.  Stopped it about 3 months ago.   Refuses flu shot.  His colonoscopy is UTD- last done in 2013.    He does take an aspirin 81 mg once a day.    He does not have any history of CAD.  His mother did have DM and had heart problems in her 8450s.  He does exercise on the treadmill and by lifting weights. He does this regularly and recently and has not noted any chest pains with exercise.  No history of CAD  Patient Active Problem List   Diagnosis Date Noted  . Internal and external hemorrhoids without complication 01/27/2013    Past Medical History  Diagnosis Date  . Allergy   . Hyperlipidemia   . Hemorrhoids, external   . Hemorrhoids, internal   . Colon polyp 11/02/2007    sessile polyp, external hemorrhoids.  Repeat in 5-10 years. Hung.    Past Surgical History  Procedure Laterality Date  . Bunionectomy    . Colonoscopy w/ polypectomy  09/03/2008    colon polyps. repeat in 5 years. Nat Mann    History  Substance Use Topics   . Smoking status: Never Smoker   . Smokeless tobacco: Never Used  . Alcohol Use: Yes     Comment: occ    Family History  Problem Relation Age of Onset  . Diabetes Mother   . Hypertension Mother   . Asthma Son     No Known Allergies  Medication list has been reviewed and updated.  Current Outpatient Prescriptions on File Prior to Visit  Medication Sig Dispense Refill  . aspirin 81 MG tablet Take 81 mg by mouth daily.      Marland Kitchen. co-enzyme Q-10 30 MG capsule Take 30 mg by mouth 3 (three) times daily.      . cycloSPORINE (RESTASIS) 0.05 % ophthalmic emulsion 1 drop 2 (two) times daily.       No current facility-administered medications on file prior to visit.    Review of Systems:  As per HPI- otherwise negative.   Physical Examination: Filed Vitals:   11/07/13 0942  BP: 132/84  Pulse: 82  Temp: 98.9 F (37.2 C)  Resp: 17   Filed Vitals:   11/07/13 0942  Height: 5\' 10"  (1.778 m)  Weight: 218 lb (98.884 kg)  Body mass index is 31.28 kg/(m^2). Ideal Body Weight: Weight in (lb) to have BMI = 25: 173.9  GEN: WDWN, NAD, Non-toxic, A & O x 3, looks well HEENT: Atraumatic, Normocephalic. Neck supple. No masses, No LAD.  Bilateral TM wnl, oropharynx normal.  PEERL,EOMI.  Ears and Nose: No external deformity. CV: RRR, No M/G/R. No JVD. No thrill. No extra heart sounds. PULM: CTA B, no wheezes, crackles, rhonchi. No retractions. No resp. distress. No accessory muscle use. ABD: S, NT, ND, +BS. No rebound. No HSM. EXTR: No c/c/e NEURO Normal gait.  PSYCH: Normally interactive. Conversant. Not depressed or anxious appearing.  Calm demeanor.  GU: normal scrotum and contents, normal penis, normal rectal exam and prostate Cannot reproduce pain in his chest or back by pressing on his trunk   EKG: NSR, compared to EKG from 2012 and there is no significant change.    GI cocktail: reduced pain from 8/10 to 2/10 per his report, and he noted continued improvement through the visit.    [facility] reading (PRIMARY) by  Dr. Patsy Lager. CXR: negative  CHEST 2 VIEW  COMPARISON: None.  FINDINGS: The heart size and mediastinal contours are within normal limits. Both lungs are clear. The visualized skeletal structures are unremarkable.  IMPRESSION: No active cardiopulmonary disease.   Assessment and Plan: Physical exam - Plan: CBC, Comprehensive metabolic panel  Burning in the chest - Plan: GI COCKTAIL UP TO 45 CC, EKG 12-Lead, sucralfate (CARAFATE) 1 G tablet, DG Chest 2 View, omeprazole (PRILOSEC) 20 MG capsule  History of high cholesterol - Plan: Lipid panel  Screening for prostate cancer - Plan: PSA  CPE: labs as above, immunizations UTD except he refused flu shot. Will follow-up on his lipids today Chest pain and burning/ burning in his back.  explained that it is possible this may indicate cardiac ischemia or another serious etiology.  My work- up here so far is reassuring but not definitive. Offered ER evaluation as the most conservative next step.  However at this time he declines ER evaluation and prefers to continue treatment for GERD with prilosec and carafate.  He will seek help if he is getting worse however   Signed Abbe Amsterdam, MD

## 2013-11-08 LAB — PSA: PSA: 0.84 ng/mL (ref <=4.00)

## 2013-11-09 ENCOUNTER — Other Ambulatory Visit: Payer: Self-pay | Admitting: Radiology

## 2013-11-09 ENCOUNTER — Telehealth: Payer: Self-pay | Admitting: Radiology

## 2013-11-09 LAB — BILIRUBIN, FRACTIONATED(TOT/DIR/INDIR)
BILIRUBIN DIRECT: 0.2 mg/dL (ref 0.0–0.3)
BILIRUBIN INDIRECT: 0.9 mg/dL (ref 0.2–1.2)
Total Bilirubin: 1.1 mg/dL (ref 0.2–1.2)

## 2013-11-09 NOTE — Telephone Encounter (Signed)
Added lab test

## 2014-06-02 ENCOUNTER — Telehealth: Payer: Self-pay

## 2014-06-02 NOTE — Telephone Encounter (Signed)
Left message with patient asking him to return our call. His request for records does not specify what he is needing. Placed in pending box for now.

## 2014-06-08 NOTE — Telephone Encounter (Signed)
Tried to call patient twice to clarify records request but phone only rang once then static. Will try again. Request still in pending box.

## 2014-06-14 NOTE — Telephone Encounter (Signed)
Left message for patient to call back and clarify request.

## 2014-06-18 ENCOUNTER — Other Ambulatory Visit: Payer: Self-pay

## 2015-04-17 ENCOUNTER — Ambulatory Visit (INDEPENDENT_AMBULATORY_CARE_PROVIDER_SITE_OTHER): Payer: BLUE CROSS/BLUE SHIELD | Admitting: Physician Assistant

## 2015-04-17 VITALS — BP 126/80 | HR 70 | Temp 98.3°F | Resp 16 | Ht 70.5 in | Wt 208.4 lb

## 2015-04-17 DIAGNOSIS — Z130 Encounter for screening for diseases of the blood and blood-forming organs and certain disorders involving the immune mechanism: Secondary | ICD-10-CM | POA: Diagnosis not present

## 2015-04-17 DIAGNOSIS — Z131 Encounter for screening for diabetes mellitus: Secondary | ICD-10-CM | POA: Diagnosis not present

## 2015-04-17 DIAGNOSIS — R42 Dizziness and giddiness: Secondary | ICD-10-CM

## 2015-04-17 DIAGNOSIS — Z1389 Encounter for screening for other disorder: Secondary | ICD-10-CM

## 2015-04-17 DIAGNOSIS — E785 Hyperlipidemia, unspecified: Secondary | ICD-10-CM

## 2015-04-17 DIAGNOSIS — Z0000 Encounter for general adult medical examination without abnormal findings: Secondary | ICD-10-CM | POA: Diagnosis not present

## 2015-04-17 LAB — POCT UA - MICROSCOPIC ONLY
BACTERIA, U MICROSCOPIC: NEGATIVE
CASTS, UR, LPF, POC: NEGATIVE
Crystals, Ur, HPF, POC: NEGATIVE
MUCUS UA: POSITIVE
RBC, urine, microscopic: NEGATIVE
YEAST UA: NEGATIVE

## 2015-04-17 LAB — POCT URINALYSIS DIPSTICK
BILIRUBIN UA: NEGATIVE
Glucose, UA: NEGATIVE
Ketones, UA: NEGATIVE
LEUKOCYTES UA: NEGATIVE
Nitrite, UA: NEGATIVE
PH UA: 5.5
RBC UA: NEGATIVE
Spec Grav, UA: >=1.03
Urobilinogen, UA: 0.2

## 2015-04-17 NOTE — Patient Instructions (Signed)
Your exam is normal today. We are checking labwork today and will be in touch.  We are ordering an echocardiogram and carotid scans to make sure there is nothing abnormal since you had that dizzy episode.  We'll be in touch with you when those are scheduled.  If you have another episode be sure to go to the ER asap or come to see [place] right away if the episode resolves quickly. We'll likely do a head scan and have you see neurology.    High Cholesterol High cholesterol refers to having a high level of cholesterol in your blood. Cholesterol is a white, waxy, fat-like protein that your body needs in small amounts. Your liver makes all the cholesterol you need. Excess cholesterol comes from the food you eat. Cholesterol travels in your bloodstream through your blood vessels. If you have high cholesterol, deposits (plaque) may build up on the walls of your blood vessels. This makes the arteries narrower and stiffer. Plaque increases your risk of heart attack and stroke. Work with your health care provider to keep your cholesterol levels in a healthy range. RISK FACTORS Several things can make you more likely to have high cholesterol. These include:   Eating foods high in animal fat (saturated fat) or cholesterol.  Being overweight.  Not getting enough exercise.  Having a family history of high cholesterol. SIGNS AND SYMPTOMS High cholesterol does not cause symptoms. DIAGNOSIS  Your health care provider can do a blood test to check whether you have high cholesterol. If you are older than 20, your health care provider may check your cholesterol every 4-6 years. You may be checked more often if you already have high cholesterol or other risk factors for heart disease. The blood test for cholesterol measures the following:  Bad cholesterol (LDL cholesterol). This is the type of cholesterol that causes heart disease. This number should be less than 100.  Good cholesterol (HDL cholesterol). This type  helps protect against heart disease. A healthy level of HDL cholesterol is 60 or higher.  Total cholesterol. This is the combined number of LDL cholesterol and HDL cholesterol. A healthy number is less than 200. TREATMENT  High cholesterol can be treated with diet changes, lifestyle changes, and medicine.   Diet changes may include eating more whole grains, fruits, vegetables, nuts, and fish. You may also have to cut back on red meat and foods with a lot of added sugar.  Lifestyle changes may include getting at least 40 minutes of aerobic exercise three times a week. Aerobic exercises include walking, biking, and swimming. Aerobic exercise along with a healthy diet can help you maintain a healthy weight. Lifestyle changes may also include quitting smoking.  If diet and lifestyle changes are not enough to lower your cholesterol, your health care provider may prescribe a statin medicine. This medicine has been shown to lower cholesterol and also lower the risk of heart disease. HOME CARE INSTRUCTIONS  Only take over-the-counter or prescription medicines as directed by your health care provider.   Follow a healthy diet as directed by your health care provider. For instance:   Eat chicken (without skin), fish, veal, shellfish, ground [place] breast, and round or loin cuts of red meat.  Do not eat fried foods and fatty meats, such as hot dogs and salami.   Eat plenty of fruits, such as apples.   Eat plenty of vegetables, such as broccoli, potatoes, and carrots.   Eat beans, peas, and lentils.   Eat grains, such  as barley, rice, couscous, and bulgur wheat.   Eat pasta without cream sauces.   Use skim or nonfat milk and low-fat or nonfat yogurt and cheeses. Do not eat or drink whole milk, cream, ice cream, egg yolks, and hard cheeses.   Do not eat stick margarine or tub margarines that contain trans fats (also called partially hydrogenated oils).   Do not eat cakes, cookies,  crackers, or other baked goods that contain trans fats.   Do not eat saturated tropical oils, such as coconut and palm oil.   Exercise as directed by your health care provider. Increase your activity level with activities such as gardening or walking.   Keep all follow-up appointments.  SEEK MEDICAL CARE IF:  You are struggling to maintain a healthy diet or weight.  You need help starting an exercise program.  You need help to stop smoking. SEEK IMMEDIATE MEDICAL CARE IF:  You have chest pain.  You have trouble breathing. Document Released: 08/20/2005 Document Revised: 01/04/2014 Document Reviewed: 06/12/2013 [facility] Patient Information 2015 [place], [place]. This information is not intended to replace advice given to you by your health care provider. Make sure you discuss any questions you have with your health care provider.   Health Maintenance A healthy lifestyle and preventative care can promote health and wellness.  Maintain regular health, dental, and eye exams.  Eat a healthy diet. Foods like vegetables, fruits, whole grains, low-fat dairy products, and lean protein foods contain the nutrients you need and are low in calories. Decrease your intake of foods high in solid fats, added sugars, and salt. Get information about a proper diet from your health care provider, if necessary.  Regular physical exercise is one of the most important things you can do for your health. Most adults should get at least 150 minutes of moderate-intensity exercise (any activity that increases your heart rate and causes you to sweat) each week. In addition, most adults need muscle-strengthening exercises on 2 or more days a week.   Maintain a healthy weight. The body mass index (BMI) is a screening tool to identify possible weight problems. It provides an estimate of body fat based on height and weight. Your health care provider can find your BMI and can help you achieve or maintain a healthy  weight. For males 20 years and older:  A BMI below 18.5 is considered underweight.  A BMI of 18.5 to 24.9 is normal.  A BMI of 25 to 29.9 is considered overweight.  A BMI of 30 and above is considered obese.  Maintain normal blood lipids and cholesterol by exercising and minimizing your intake of saturated fat. Eat a balanced diet with plenty of fruits and vegetables. Blood tests for lipids and cholesterol should begin at age 60 and be repeated every 5 years. If your lipid or cholesterol levels are high, you are over age 23, or you are at high risk for heart disease, you may need your cholesterol levels checked more frequently.Ongoing high lipid and cholesterol levels should be treated with medicines if diet and exercise are not working.  If you smoke, find out from your health care provider how to quit. If you do not use tobacco, do not start.  Lung cancer screening is recommended for adults aged 55-80 years who are at high risk for developing lung cancer because of a history of smoking. A yearly low-dose CT scan of the lungs is recommended for people who have at least a 30-pack-year history of smoking and are  current smokers or have quit within the past 15 years. A pack year of smoking is smoking an average of 1 pack of cigarettes a day for 1 year (for example, a 30-pack-year history of smoking could mean smoking 1 pack a day for 30 years or 2 packs a day for 15 years). Yearly screening should continue until the smoker has stopped smoking for at least 15 years. Yearly screening should be stopped for people who develop a health problem that would prevent them from having lung cancer treatment.  If you choose to drink alcohol, do not have more than 2 drinks per day. One drink is considered to be 12 oz (360 mL) of beer, 5 oz (150 mL) of wine, or 1.5 oz (45 mL) of liquor.  Avoid the use of street drugs. Do not share needles with anyone. Ask for help if you need support or instructions about stopping  the use of drugs.  High blood pressure causes heart disease and increases the risk of stroke. Blood pressure should be checked at least every 1-2 years. Ongoing high blood pressure should be treated with medicines if weight loss and exercise are not effective.  If you are 60-32 years old, ask your health care provider if you should take aspirin to prevent heart disease.  Diabetes screening involves taking a blood sample to check your fasting blood sugar level. This should be done once every 3 years after age 32 if you are at a normal weight and without risk factors for diabetes. Testing should be considered at a younger age or be carried out more frequently if you are overweight and have at least 1 risk factor for diabetes.  Colorectal cancer can be detected and often prevented. Most routine colorectal cancer screening begins at the age of 72 and continues through age 28. However, your health care provider may recommend screening at an earlier age if you have risk factors for colon cancer. On a yearly basis, your health care provider may provide home test kits to check for hidden blood in the stool. A small camera at the end of a tube may be used to directly examine the colon (sigmoidoscopy or colonoscopy) to detect the earliest forms of colorectal cancer. Talk to your health care provider about this at age 41 when routine screening begins. A direct exam of the colon should be repeated every 5-10 years through age 38, unless early forms of precancerous polyps or small growths are found.  People who are at an increased risk for hepatitis B should be screened for this virus. You are considered at high risk for hepatitis B if:  You were born in a country where hepatitis B occurs often. Talk with your health care provider about which countries are considered high risk.  Your parents were born in a high-risk country and you have not received a shot to protect against hepatitis B (hepatitis B  vaccine).  You have HIV or AIDS.  You use needles to inject street drugs.  You live with, or have sex with, someone who has hepatitis B.  You are a man who has sex with other men (MSM).  You get hemodialysis treatment.  You take certain medicines for conditions like cancer, organ transplantation, and autoimmune conditions.  Hepatitis C blood testing is recommended for all people born from [birth year] through 1965 and any individual with known risk factors for hepatitis C.  Healthy men should no longer receive prostate-specific antigen (PSA) blood tests as part of routine cancer screening.  Talk to your health care provider about prostate cancer screening.  Testicular cancer screening is not recommended for adolescents or adult males who have no symptoms. Screening includes self-exam, a health care provider exam, and other screening tests. Consult with your health care provider about any symptoms you have or any concerns you have about testicular cancer.  Practice safe sex. Use condoms and avoid high-risk sexual practices to reduce the spread of sexually transmitted infections (STIs).  You should be screened for STIs, including gonorrhea and chlamydia if:  You are sexually active and are younger than 24 years.  You are older than 24 years, and your health care provider tells you that you are at risk for this type of infection.  Your sexual activity has changed since you were last screened, and you are at an increased risk for chlamydia or gonorrhea. Ask your health care provider if you are at risk.  If you are at risk of being infected with HIV, it is recommended that you take a prescription medicine daily to prevent HIV infection. This is called pre-exposure prophylaxis (PrEP). You are considered at risk if:  You are a man who has sex with other men (MSM).  You are a heterosexual man who is sexually active with multiple partners.  You take drugs by injection.  You are sexually active  with a partner who has HIV.  Talk with your health care provider about whether you are at high risk of being infected with HIV. If you choose to begin PrEP, you should first be tested for HIV. You should then be tested every 3 months for as long as you are taking PrEP.  Use sunscreen. Apply sunscreen liberally and repeatedly throughout the day. You should seek shade when your shadow is shorter than you. Protect yourself by wearing long sleeves, pants, a wide-brimmed hat, and sunglasses year round whenever you are outdoors.  Tell your health care provider of new moles or changes in moles, especially if there is a change in shape or color. Also, tell your health care provider if a mole is larger than the size of a pencil eraser.  A one-time screening for abdominal aortic aneurysm (AAA) and surgical repair of large AAAs by ultrasound is recommended for men aged 65-75 years who are current or former smokers.  Stay current with your vaccines (immunizations). Document Released: 02/16/2008 Document Revised: 08/25/2013 Document Reviewed: 01/15/2011 [facility] Patient Information 2015 [place], [place]. This information is not intended to replace advice given to you by your health care provider. Make sure you discuss any questions you have with your health care provider.

## 2015-04-17 NOTE — Progress Notes (Signed)
Subjective:    Patient ID: Steve Graham, male    DOB: 26-Dec-1955, 59 y.o.   MRN: [id number]  Chief Complaint  Patient presents with  . Annual Exam   Patient Active Problem List   Diagnosis Date Noted  . Hyperlipidemia 04/17/2015  . Internal and external hemorrhoids without complication 01/27/2013   Prior to Admission medications   Medication Sig Start Date End Date Taking? Authorizing Provider  aspirin 81 MG tablet Take 81 mg by mouth daily.   Yes Historical Provider, MD  cycloSPORINE (RESTASIS) 0.05 % ophthalmic emulsion 1 drop 2 (two) times daily.   Yes Historical Provider, MD  Multiple Vitamins-Minerals (MULTIVITAMIN WITH MINERALS) tablet Take 1 tablet by mouth daily.   Yes Historical Provider, MD  co-enzyme Q-10 30 MG capsule Take 30 mg by mouth 3 (three) times daily.    Historical Provider, MD  omeprazole (PRILOSEC) 20 MG capsule Take 1 capsule (20 mg total) by mouth daily. Patient not taking: Reported on 04/17/2015 11/07/13   Pearline Cables, MD   Medications, allergies, past medical history, surgical history, family history, social history and problem list reviewed and updated.  HPI  44 yom presents for cpe and mentioning recent dizzy episodes.   Doing well since last cpe 1.5 yrs ago.   Exercise: Most days of week, elliptical and weights. No cp, sob, palps, presyncope, syncope with exercise.  Diet: None specific, does not watch saturated fat intake.  Sees optometrist yearly and dentist q6 months.  Takes asa daily. Hx high cho, has been on lipitor in past but stopped due to myalgias. Has had 2 colonoscopies. Hx polyps. Last was 5 yrs ago, he received letter in mail that he is due to repeat. Planning to do this soon.   Also mentions a dizzy episode 10 days ago. Was driving and had sudden onset dizzy feeling, lasted 10 mins. No vertigo. Assoc bilateral blurry vision. Sx resolved on own. No assoc palps. Has never had similar episode. Unsure if he was dehydrated. No stressors  at the time. No hx migraines.   Review of Systems Dizzines as above. Myalgias as above.  Otherwise negative per ROS sheet.     Objective:   Physical Exam  Constitutional: He is oriented to person, place, and time. He appears well-developed and well-nourished.  Non-toxic appearance. He does not have a sickly appearance. He does not appear ill. No distress.  BP 126/80 mmHg  Pulse 70  Temp(Src) 98.3 F (36.8 C) (Oral)  Resp 16  Ht 5' 10.5" (1.791 m)  Wt 208 lb 6.4 oz (94.53 kg)  BMI 29.47 kg/m2  SpO2 98%   HENT:  Right Ear: Tympanic membrane normal.  Left Ear: Tympanic membrane normal.  Nose: Right sinus exhibits no maxillary sinus tenderness and no frontal sinus tenderness. Left sinus exhibits no maxillary sinus tenderness and no frontal sinus tenderness.  Mouth/Throat: Uvula is midline, oropharynx is clear and moist and mucous membranes are normal.  Eyes: Conjunctivae and EOM are normal. Pupils are equal, round, and reactive to light.  Neck: Carotid bruit is not present. No thyroid mass and no thyromegaly present.  Cardiovascular: Normal rate, regular rhythm and normal heart sounds.  Exam reveals no S3 and no S4.   Pulses:      Posterior tibial pulses are 2+ on the right side, and 2+ on the left side.  Pulmonary/Chest: Effort normal and breath sounds normal.  Abdominal: Soft. Normal appearance and bowel sounds are normal. There is no tenderness.  Musculoskeletal: Normal  range of motion.  Neurological: He is alert and oriented to person, place, and time. He has normal strength. No cranial nerve deficit or sensory deficit. He displays a negative Romberg sign.   EKG read by Dr Neva Seat: When compared to March 2015 no apparent significant changes, still with probable early repol but larger voltage in anterior leads.      Assessment & Plan:   Annual physical exam --normal exam, normal vitals, labs as below --encouraged colonoscopy  Screening for nephropathy - Plan: POCT urinalysis  dipstick, POCT UA - Microscopic Only, COMPLETE METABOLIC PANEL WITH GFR  Hyperlipidemia - Plan: Lipid panel --checking today --as has tolerated Lipitor poorly in past could start on low dose pravastatin with possible every other day dosing if elevated  Screening for deficiency anemia - Plan: CBC  Screening for diabetes mellitus - Plan: COMPLETE METABOLIC PANEL WITH GFR  Dizziness - Plan: EKG 12-Lead, TSH, Echocardiogram, US Carotid Duplex Bilateral --unsure of etiology but concerning for possible TIA --carotids, echo today, ekg with no arrhyhtmia but possible lvh --tsh today --will f/u with test results --ER asap if recurrence or see [place] if sx resolve soon after next episode --> likely imaging and neuro at that point  Donnajean Lopes, PA-C Physician Assistant-Certified [facility]  04/17/2015 12:36 PM

## 2015-04-18 LAB — LIPID PANEL
Cholesterol: 202 mg/dL — ABNORMAL HIGH (ref 125–200)
HDL: 47 mg/dL (ref >=40)
LDL CALC: 139 mg/dL — AB (ref <130)
TRIGLYCERIDES: 78 mg/dL (ref <150)
Total CHOL/HDL Ratio: 4.3 Ratio (ref <=5.0)
VLDL: 16 mg/dL (ref <30)

## 2015-04-18 LAB — CBC
HCT: 42.3 % (ref 39.0–52.0)
Hemoglobin: 14.1 g/dL (ref 13.0–17.0)
MCH: 30 pg (ref 26.0–34.0)
MCHC: 33.3 g/dL (ref 30.0–36.0)
MCV: 90 fL (ref 78.0–100.0)
MPV: 9.9 fL (ref 8.6–12.4)
PLATELETS: 240 10*3/uL (ref 150–400)
RBC: 4.7 MIL/uL (ref 4.22–5.81)
RDW: 15.6 % — AB (ref 11.5–15.5)
WBC: 5 10*3/uL (ref 4.0–10.5)

## 2015-04-18 LAB — COMPLETE METABOLIC PANEL WITH GFR
ALT: 34 U/L (ref 9–46)
AST: 32 U/L (ref 10–35)
Albumin: 4.2 g/dL (ref 3.6–5.1)
Alkaline Phosphatase: 51 U/L (ref 40–115)
BILIRUBIN TOTAL: 1.5 mg/dL — AB (ref 0.2–1.2)
BUN: 18 mg/dL (ref 7–25)
CHLORIDE: 103 mmol/L (ref 98–110)
CO2: 27 mmol/L (ref 20–31)
CREATININE: 1.13 mg/dL (ref 0.70–1.33)
Calcium: 9.6 mg/dL (ref 8.6–10.3)
GFR, EST AFRICAN AMERICAN: 82 mL/min (ref >=60)
GFR, Est Non African American: 71 mL/min (ref >=60)
GLUCOSE: 92 mg/dL (ref 65–99)
Potassium: 4.5 mmol/L (ref 3.5–5.3)
SODIUM: 139 mmol/L (ref 135–146)
TOTAL PROTEIN: 7.2 g/dL (ref 6.1–8.1)

## 2015-04-18 LAB — TSH: TSH: 1.253 u[IU]/mL (ref 0.350–4.500)

## 2015-04-20 MED ORDER — PRAVASTATIN SODIUM 40 MG PO TABS: 40.0000 mg | ORAL_TABLET | Freq: Every day | ORAL | Status: DC

## 2015-04-20 NOTE — Addendum Note (Signed)
Addended byDonnajean Lopes on: 04/20/2015 09:36 PM   Modules accepted: Orders

## 2015-04-27 ENCOUNTER — Telehealth: Payer: Self-pay

## 2015-04-27 NOTE — Telephone Encounter (Signed)
Pt called about labs. Relayed message sent to MyChart

## 2015-05-11 ENCOUNTER — Telehealth: Payer: Self-pay

## 2015-05-11 DIAGNOSIS — R42 Dizziness and giddiness: Secondary | ICD-10-CM

## 2015-05-11 DIAGNOSIS — E785 Hyperlipidemia, unspecified: Secondary | ICD-10-CM

## 2015-05-11 NOTE — Telephone Encounter (Signed)
Patient called and said that he would like his carotid US done at the [facility] at [facility] along with his echo, instead of the hospital.  According to [facility], the current order is in for a hospital study, and it would need to be changed to a cupid order number for them to perform the service.  CB#: [phone]

## 2015-05-11 NOTE — Telephone Encounter (Signed)
Referral placed for [facility].

## 2015-05-12 NOTE — Telephone Encounter (Signed)
Replaced order with study number listed.

## 2015-05-12 NOTE — Telephone Encounter (Signed)
Order was entered incorrectly according to [facility]. Order number must be [id number].

## 2015-05-18 ENCOUNTER — Other Ambulatory Visit: Payer: Self-pay

## 2015-05-18 ENCOUNTER — Ambulatory Visit (HOSPITAL_COMMUNITY): Payer: BLUE CROSS/BLUE SHIELD | Attending: Cardiology

## 2015-05-18 DIAGNOSIS — E785 Hyperlipidemia, unspecified: Secondary | ICD-10-CM | POA: Insufficient documentation

## 2015-05-18 DIAGNOSIS — R42 Dizziness and giddiness: Secondary | ICD-10-CM

## 2015-05-18 DIAGNOSIS — I340 Nonrheumatic mitral (valve) insufficiency: Secondary | ICD-10-CM | POA: Diagnosis not present

## 2015-05-18 DIAGNOSIS — I77810 Thoracic aortic ectasia: Secondary | ICD-10-CM | POA: Insufficient documentation

## 2015-05-18 DIAGNOSIS — G459 Transient cerebral ischemic attack, unspecified: Secondary | ICD-10-CM | POA: Insufficient documentation

## 2015-05-18 DIAGNOSIS — I517 Cardiomegaly: Secondary | ICD-10-CM | POA: Diagnosis not present

## 2015-05-18 NOTE — Telephone Encounter (Signed)
Sent order to [facility]-05/18/15

## 2015-05-29 ENCOUNTER — Ambulatory Visit (INDEPENDENT_AMBULATORY_CARE_PROVIDER_SITE_OTHER): Payer: BLUE CROSS/BLUE SHIELD | Admitting: Family Medicine

## 2015-05-29 VITALS — BP 128/80 | HR 64 | Temp 98.2°F | Resp 16 | Ht 70.0 in | Wt 208.0 lb

## 2015-05-29 DIAGNOSIS — M7551 Bursitis of right shoulder: Secondary | ICD-10-CM

## 2015-05-29 DIAGNOSIS — G5601 Carpal tunnel syndrome, right upper limb: Secondary | ICD-10-CM

## 2015-05-29 MED ORDER — PREDNISONE 20 MG PO TABS
ORAL_TABLET | ORAL | Status: DC
Start: 2015-05-29 — End: 2015-12-06

## 2015-05-29 MED ORDER — MELOXICAM 15 MG PO TABS: 15.0000 mg | ORAL_TABLET | Freq: Every day | ORAL | Status: DC

## 2015-05-29 NOTE — Patient Instructions (Addendum)
Afer the prednisone is complete, then start the meloxicam every morning for at least 2 weeks. Ice your shoulder.  Impingement Syndrome, Rotator Cuff, Bursitis with Rehab Impingement syndrome is a condition that involves inflammation of the tendons of the rotator cuff and the subacromial bursa, that causes pain in the shoulder. The rotator cuff consists of four tendons and muscles that control much of the shoulder and upper arm function. The subacromial bursa is a fluid filled sac that helps reduce friction between the rotator cuff and one of the bones of the shoulder (acromion). Impingement syndrome is usually an overuse injury that causes swelling of the bursa (bursitis), swelling of the tendon (tendonitis), and/or a tear of the tendon (strain). Strains are classified into three categories. Grade 1 strains cause pain, but the tendon is not lengthened. Grade 2 strains include a lengthened ligament, due to the ligament being stretched or partially ruptured. With grade 2 strains there is still function, although the function may be decreased. Grade 3 strains include a complete tear of the tendon or muscle, and function is usually impaired. SYMPTOMS   Pain around the shoulder, often at the outer portion of the upper arm.  Pain that gets worse with shoulder function, especially when reaching overhead or lifting.  Sometimes, aching when not using the arm.  Pain that wakes you up at night.  Sometimes, tenderness, swelling, warmth, or redness over the affected area.  Loss of strength.  Limited motion of the shoulder, especially reaching behind the back (to the back pocket or to unhook bra) or across your body.  Crackling sound (crepitation) when moving the arm.  Biceps tendon pain and inflammation (in the front of the shoulder). Worse when bending the elbow or lifting. CAUSES  Impingement syndrome is often an overuse injury, in which chronic (repetitive) motions cause the tendons or bursa to  become inflamed. A strain occurs when a force is paced on the tendon or muscle that is greater than it can withstand. Common mechanisms of injury include: Stress from sudden increase in duration, frequency, or intensity of training.  Direct hit (trauma) to the shoulder.  Aging, erosion of the tendon with normal use.  Bony bump on shoulder (acromial spur). RISK INCREASES WITH:  Contact sports (football, wrestling, boxing).  Throwing sports (baseball, tennis, volleyball).  Weightlifting and bodybuilding.  Heavy labor.  Previous injury to the rotator cuff, including impingement.  Poor shoulder strength and flexibility.  Failure to warm up properly before activity.  Inadequate protective equipment.  Old age.  Bony bump on shoulder (acromial spur). PREVENTION   Warm up and stretch properly before activity.  Allow for adequate recovery between workouts.  Maintain physical fitness:  Strength, flexibility, and endurance.  Cardiovascular fitness.  Learn and use proper exercise technique. PROGNOSIS  If treated properly, impingement syndrome usually goes away within 6 weeks. Sometimes surgery is required.  RELATED COMPLICATIONS   Longer healing time if not properly treated, or if not given enough time to heal.  Recurring symptoms, that result in a chronic condition.  Shoulder stiffness, frozen shoulder, or loss of motion.  Rotator cuff tendon tear.  Recurring symptoms, especially if activity is resumed too soon, with overuse, with a direct blow, or when using poor technique. TREATMENT  Treatment first involves the use of ice and medicine, to reduce pain and inflammation. The use of strengthening and stretching exercises may help reduce pain with activity. These exercises may be performed at home or with a therapist. If non-surgical treatment is unsuccessful  after more than 6 months, surgery may be advised. After surgery and rehabilitation, activity is usually possible in  3 months.  MEDICATION  If pain medicine is needed, nonsteroidal anti-inflammatory medicines (aspirin and ibuprofen), or other minor pain relievers (acetaminophen), are often advised.  Do not take pain medicine for 7 days before surgery.  Prescription pain relievers may be given, if your caregiver thinks they are needed. Use only as directed and only as much as you need.  Corticosteroid injections may be given by your caregiver. These injections should be reserved for the most serious cases, because they may only be given a certain number of times. HEAT AND COLD  Cold treatment (icing) should be applied for 10 to 15 minutes every 2 to 3 hours for inflammation and pain, and immediately after activity that aggravates your symptoms. Use ice packs or an ice massage.  Heat treatment may be used before performing stretching and strengthening activities prescribed by your caregiver, physical therapist, or athletic trainer. Use a heat pack or a warm water soak. SEEK MEDICAL CARE IF:   Symptoms get worse or do not improve in 4 to 6 weeks, despite treatment.  New, unexplained symptoms develop. (Drugs used in treatment may produce side effects.) EXERCISES  RANGE OF MOTION (ROM) AND STRETCHING EXERCISES - Impingement Syndrome (Rotator Cuff  Tendinitis, Bursitis) These exercises may help you when beginning to rehabilitate your injury. Your symptoms may go away with or without further involvement from your physician, physical therapist or athletic trainer. While completing these exercises, remember:   Restoring tissue flexibility helps normal motion to return to the joints. This allows healthier, less painful movement and activity.  An effective stretch should be held for at least 30 seconds.  A stretch should never be painful. You should only feel a gentle lengthening or release in the stretched tissue. STRETCH - Flexion, Standing  Stand with good posture. With an underhand grip on your right /  left hand, and an overhand grip on the opposite hand, grasp a broomstick or cane so that your hands are a little more than shoulder width apart.  Keeping your right / left elbow straight and shoulder muscles relaxed, push the stick with your opposite hand, to raise your right / left arm in front of your body and then overhead. Raise your arm until you feel a stretch in your right / left shoulder, but before you have increased shoulder pain.  Try to avoid shrugging your right / left shoulder as your arm rises, by keeping your shoulder blade tucked down and toward your mid-back spine. Hold for __________ seconds.  Slowly return to the starting position. Repeat __________ times. Complete this exercise __________ times per day. STRETCH - Abduction, Supine  Lie on your back. With an underhand grip on your right / left hand and an overhand grip on the opposite hand, grasp a broomstick or cane so that your hands are a little more than shoulder width apart.  Keeping your right / left elbow straight and your shoulder muscles relaxed, push the stick with your opposite hand, to raise your right / left arm out to the side of your body and then overhead. Raise your arm until you feel a stretch in your right / left shoulder, but before you have increased shoulder pain.  Try to avoid shrugging your right / left shoulder as your arm rises, by keeping your shoulder blade tucked down and toward your mid-back spine. Hold for __________ seconds.  Slowly return to the  starting position. Repeat __________ times. Complete this exercise __________ times per day. ROM - Flexion, Active-Assisted  Lie on your back. You may bend your knees for comfort.  Grasp a broomstick or cane so your hands are about shoulder width apart. Your right / left hand should grip the end of the stick, so that your hand is positioned "thumbs-up," as if you were about to shake hands.  Using your healthy arm to lead, raise your right / left arm  overhead, until you feel a gentle stretch in your shoulder. Hold for __________ seconds.  Use the stick to assist in returning your right / left arm to its starting position. Repeat __________ times. Complete this exercise __________ times per day.  ROM - Internal Rotation, Supine   Lie on your back on a firm surface. Place your right / left elbow about 60 degrees away from your side. Elevate your elbow with a folded towel, so that the elbow and shoulder are the same height.  Using a broomstick or cane and your strong arm, pull your right / left hand toward your body until you feel a gentle stretch, but no increase in your shoulder pain. Keep your shoulder and elbow in place throughout the exercise.  Hold for __________ seconds. Slowly return to the starting position. Repeat __________ times. Complete this exercise __________ times per day. STRETCH - Internal Rotation  Place your right / left hand behind your back, palm up.  Throw a towel or belt over your opposite shoulder. Grasp the towel with your right / left hand.  While keeping an upright posture, gently pull up on the towel, until you feel a stretch in the front of your right / left shoulder.  Avoid shrugging your right / left shoulder as your arm rises, by keeping your shoulder blade tucked down and toward your mid-back spine.  Hold for __________ seconds. Release the stretch, by lowering your healthy hand. Repeat __________ times. Complete this exercise __________ times per day. ROM - Internal Rotation   Using an underhand grip, grasp a stick behind your back with both hands.  While standing upright with good posture, slide the stick up your back until you feel a mild stretch in the front of your shoulder.  Hold for __________ seconds. Slowly return to your starting position. Repeat __________ times. Complete this exercise __________ times per day.  STRETCH - Posterior Shoulder Capsule   Stand or sit with good posture.  Grasp your right / left elbow and draw it across your chest, keeping it at the same height as your shoulder.  Pull your elbow, so your upper arm comes in closer to your chest. Pull until you feel a gentle stretch in the back of your shoulder.  Hold for __________ seconds. Repeat __________ times. Complete this exercise __________ times per day. STRENGTHENING EXERCISES - Impingement Syndrome (Rotator Cuff Tendinitis, Bursitis) These exercises may help you when beginning to rehabilitate your injury. They may resolve your symptoms with or without further involvement from your physician, physical therapist or athletic trainer. While completing these exercises, remember:  Muscles can gain both the endurance and the strength needed for everyday activities through controlled exercises.  Complete these exercises as instructed by your physician, physical therapist or athletic trainer. Increase the resistance and repetitions only as guided.  You may experience muscle soreness or fatigue, but the pain or discomfort you are trying to eliminate should never worsen during these exercises. If this pain does get worse, stop and make sure  you are following the directions exactly. If the pain is still present after adjustments, discontinue the exercise until you can discuss the trouble with your clinician.  During your recovery, avoid activity or exercises which involve actions that place your injured hand or elbow above your head or behind your back or head. These positions stress the tissues which you are trying to heal. STRENGTH - Scapular Depression and Adduction   With good posture, sit on a firm chair. Support your arms in front of you, with pillows, arm rests, or on a table top. Have your elbows in line with the sides of your body.  Gently draw your shoulder blades down and toward your mid-back spine. Gradually increase the tension, without tensing the muscles along the top of your shoulders and the back of  your neck.  Hold for __________ seconds. Slowly release the tension and relax your muscles completely before starting the next repetition.  After you have practiced this exercise, remove the arm support and complete the exercise in standing as well as sitting position. Repeat __________ times. Complete this exercise __________ times per day.  STRENGTH - Shoulder Abductors, Isometric  With good posture, stand or sit about 4-6 inches from a wall, with your right / left side facing the wall.  Bend your right / left elbow. Gently press your right / left elbow into the wall. Increase the pressure gradually, until you are pressing as hard as you can, without shrugging your shoulder or increasing any shoulder discomfort.  Hold for __________ seconds.  Release the tension slowly. Relax your shoulder muscles completely before you begin the next repetition. Repeat __________ times. Complete this exercise __________ times per day.  STRENGTH - External Rotators, Isometric  Keep your right / left elbow at your side and bend it 90 degrees.  Step into a door frame so that the outside of your right / left wrist can press against the door frame without your upper arm leaving your side.  Gently press your right / left wrist into the door frame, as if you were trying to swing the back of your hand away from your stomach. Gradually increase the tension, until you are pressing as hard as you can, without shrugging your shoulder or increasing any shoulder discomfort.  Hold for __________ seconds.  Release the tension slowly. Relax your shoulder muscles completely before you begin the next repetition. Repeat __________ times. Complete this exercise __________ times per day.  STRENGTH - Supraspinatus   Stand or sit with good posture. Grasp a __________ weight, or an exercise band or tubing, so that your hand is "thumbs-up," like you are shaking hands.  Slowly lift your right / left arm in a "V" away from  your thigh, diagonally into the space between your side and straight ahead. Lift your hand to shoulder height or as far as you can, without increasing any shoulder pain. At first, many people do not lift their hands above shoulder height.  Avoid shrugging your right / left shoulder as your arm rises, by keeping your shoulder blade tucked down and toward your mid-back spine.  Hold for __________ seconds. Control the descent of your hand, as you slowly return to your starting position. Repeat __________ times. Complete this exercise __________ times per day.  STRENGTH - External Rotators  Secure a rubber exercise band or tubing to a fixed object (table, pole) so that it is at the same height as your right / left elbow when you are standing or sitting  on a firm surface.  Stand or sit so that the secured exercise band is at your uninjured side.  Bend your right / left elbow 90 degrees. Place a folded towel or small pillow under your right / left arm, so that your elbow is a few inches away from your side.  Keeping the tension on the exercise band, pull it away from your body, as if pivoting on your elbow. Be sure to keep your body steady, so that the movement is coming only from your rotating shoulder.  Hold for __________ seconds. Release the tension in a controlled manner, as you return to the starting position. Repeat __________ times. Complete this exercise __________ times per day.  STRENGTH - Internal Rotators   Secure a rubber exercise band or tubing to a fixed object (table, pole) so that it is at the same height as your right / left elbow when you are standing or sitting on a firm surface.  Stand or sit so that the secured exercise band is at your right / left side.  Bend your elbow 90 degrees. Place a folded towel or small pillow under your right / left arm so that your elbow is a few inches away from your side.  Keeping the tension on the exercise band, pull it across your body,  toward your stomach. Be sure to keep your body steady, so that the movement is coming only from your rotating shoulder.  Hold for __________ seconds. Release the tension in a controlled manner, as you return to the starting position. Repeat __________ times. Complete this exercise __________ times per day.  STRENGTH - Scapular Protractors, Standing   Stand arms length away from a wall. Place your hands on the wall, keeping your elbows straight.  Begin by dropping your shoulder blades down and toward your mid-back spine.  To strengthen your protractors, keep your shoulder blades down, but slide them forward on your rib cage. It will feel as if you are lifting the back of your rib cage away from the wall. This is a subtle motion and can be challenging to complete. Ask your caregiver for further instruction, if you are not sure you are doing the exercise correctly.  Hold for __________ seconds. Slowly return to the starting position, resting the muscles completely before starting the next repetition. Repeat __________ times. Complete this exercise __________ times per day. STRENGTH - Scapular Protractors, Supine  Lie on your back on a firm surface. Extend your right / left arm straight into the air while holding a __________ weight in your hand.  Keeping your head and back in place, lift your shoulder off the floor.  Hold for __________ seconds. Slowly return to the starting position, and allow your muscles to relax completely before starting the next repetition. Repeat __________ times. Complete this exercise __________ times per day. STRENGTH - Scapular Protractors, Quadruped  Get onto your hands and knees, with your shoulders directly over your hands (or as close as you can be, comfortably).  Keeping your elbows locked, lift the back of your rib cage up into your shoulder blades, so your mid-back rounds out. Keep your neck muscles relaxed.  Hold this position for __________ seconds.  Slowly return to the starting position and allow your muscles to relax completely before starting the next repetition. Repeat __________ times. Complete this exercise __________ times per day.  STRENGTH - Scapular Retractors  Secure a rubber exercise band or tubing to a fixed object (table, pole), so that it is  at the height of your shoulders when you are either standing, or sitting on a firm armless chair.  With a palm down grip, grasp an end of the band in each hand. Straighten your elbows and lift your hands straight in front of you, at shoulder height. Step back, away from the secured end of the band, until it becomes tense.  Squeezing your shoulder blades together, draw your elbows back toward your sides, as you bend them. Keep your upper arms lifted away from your body throughout the exercise.  Hold for __________ seconds. Slowly ease the tension on the band, as you reverse the directions and return to the starting position. Repeat __________ times. Complete this exercise __________ times per day. STRENGTH - Shoulder Extensors   Secure a rubber exercise band or tubing to a fixed object (table, pole) so that it is at the height of your shoulders when you are either standing, or sitting on a firm armless chair.  With a thumbs-up grip, grasp an end of the band in each hand. Straighten your elbows and lift your hands straight in front of you, at shoulder height. Step back, away from the secured end of the band, until it becomes tense.  Squeezing your shoulder blades together, pull your hands down to the sides of your thighs. Do not allow your hands to go behind you.  Hold for __________ seconds. Slowly ease the tension on the band, as you reverse the directions and return to the starting position. Repeat __________ times. Complete this exercise __________ times per day.  STRENGTH - Scapular Retractors and External Rotators   Secure a rubber exercise band or tubing to a fixed object (table,  pole) so that it is at the height as your shoulders, when you are either standing, or sitting on a firm armless chair.  With a palm down grip, grasp an end of the band in each hand. Bend your elbows 90 degrees and lift your elbows to shoulder height, at your sides. Step back, away from the secured end of the band, until it becomes tense.  Squeezing your shoulder blades together, rotate your shoulders so that your upper arms and elbows remain stationary, but your fists travel upward to head height.  Hold for __________ seconds. Slowly ease the tension on the band, as you reverse the directions and return to the starting position. Repeat __________ times. Complete this exercise __________ times per day.  STRENGTH - Scapular Retractors and External Rotators, Rowing   Secure a rubber exercise band or tubing to a fixed object (table, pole) so that it is at the height of your shoulders, when you are either standing, or sitting on a firm armless chair.  With a palm down grip, grasp an end of the band in each hand. Straighten your elbows and lift your hands straight in front of you, at shoulder height. Step back, away from the secured end of the band, until it becomes tense.  Step 1: Squeeze your shoulder blades together. Bending your elbows, draw your hands to your chest, as if you are rowing a boat. At the end of this motion, your hands and elbow should be at shoulder height and your elbows should be out to your sides.  Step 2: Rotate your shoulders, to raise your hands above your head. Your forearms should be vertical and your upper arms should be horizontal.  Hold for __________ seconds. Slowly ease the tension on the band, as you reverse the directions and return to the starting  position. Repeat __________ times. Complete this exercise __________ times per day.  STRENGTH - Scapular Depressors  Find a sturdy chair without wheels, such as a dining room chair.  Keeping your feet on the floor, and  your hands on the chair arms, lift your bottom up from the seat, and lock your elbows.  Keeping your elbows straight, allow gravity to pull your body weight down. Your shoulders will rise toward your ears.  Raise your body against gravity by drawing your shoulder blades down your back, shortening the distance between your shoulders and ears. Although your feet should always maintain contact with the floor, your feet should progressively support less body weight, as you get stronger.  Hold for __________ seconds. In a controlled and slow manner, lower your body weight to begin the next repetition. Repeat __________ times. Complete this exercise __________ times per day.  Document Released: 08/20/2005 Document Revised: 11/12/2011 Document Reviewed: 12/02/2008 [facility] Patient Information 2015 [place], [place]. This information is not intended to replace advice given to you by your health care Lakeesha Fontanilla. Make sure you discuss any questions you have with your health care Jhony Antrim.  Carpal Tunnel Syndrome Carpal tunnel syndrome is a disorder of the nervous system in the wrist that causes pain, hand weakness, and/or loss of feeling. Carpal tunnel syndrome is caused by the compression, stretching, or irritation of the median nerve at the wrist joint. Athletes who experience carpal tunnel syndrome may notice a decrease in their performance to the condition, especially for sports that require strong hand or wrist action.  SYMPTOMS   Tingling, numbness, or burning pain in the hand or fingers.  Inability to sleep due to pain in the hand.  Sharp pains that shoot from the wrist up the arm or to the fingers, especially at night.  Morning stiffness or cramping of the hand.  Thumb weakness, resulting in difficulty holding objects or making a fist.  Shiny, dry skin on the hand.  Reduced performance in any sport requiring a strong grip. CAUSES   Median nerve damage at the wrist is caused by pressure due to  swelling, inflammation, or scarred tissue.  Sources of pressure include:  Repetitive gripping or squeezing that causes inflammation of the tendon sheaths.  Scarring or shortening of the ligament that covers the median nerve.  Traumatic injury to the wrist or forearm such as fracture, sprain, or dislocation.  Prolonged hyperextension (wrist bent backward) or hyperflexion (wrist bent downward) of the wrist. RISK INCREASES WITH:  Diabetes mellitus.  Menopause or amenorrhea.  Rheumatoid arthritis.  Raynaud disease.  Pregnancy.  Gout.  Kidney disease.  Ganglion cyst.  Repetitive hand or wrist action.  Hypothyroidism (underactive thyroid gland).  Repetitive jolting or shaking of the hands or wrist.  Prolonged forceful weight-bearing on the hands. PREVENTION  Bracing the hand and wrist straight during activities that involve repetitive grasping.  For activities that require prolonged extension of the wrist (bending towards the top of the forearm) periodically change the position of your wrists.  Learn and use proper technique in activities that result in the wrist position in neutral to slight extension.  Avoid bending the wrist into full extension or flexion (up or down).  Keep the wrist in a straight (neutral) position. To keep the wrist in this position, wear a splint.  Avoid repetitive hand and wrist motions.  When possible avoid prolonged grasping of items (steering wheel of a car, a pen, a vacuum cleaner, or a rake).  Loosen your grip for activities that  require prolonged grasping of items.  Place keyboards and writing surfaces at the correct height as to decrease strain on the wrist and hand.  Alternate work tasks to avoid prolonged wrist flexion.  Avoid pinching activities (needlework and writing) as they may irritate your carpal tunnel syndrome.  If these activities are necessary, complete them for shorter periods of time.  When writing, use a felt tip  or rollerball pen and/or build up the grip on a pen to decrease the forces required for writing. PROGNOSIS  Carpal tunnel syndrome is usually curable with appropriate conservative treatment and sometimes resolves spontaneously. For some cases, surgery is necessary, especially if muscle wasting or nerve changes have developed.  RELATED COMPLICATIONS   Permanent numbness and a weak thumb or fingers in the affected hand.  Permanent paralysis of a portion of the hand and fingers. TREATMENT  Treatment initially consists of stopping activities that aggravate the symptoms as well as medication and ice to reduce inflammation. A wrist splint is often recommended for wear during activities of repetitive motion as well as at night. It is also important to learn and use proper technique when performing activities that typically cause pain. On occasion, a corticosteroid injection may be given. If symptoms persist despite conservative treatment, surgery may be an option. Surgical techniques free the pinched or compressed nerve. Carpal tunnel surgery is usually performed on an outpatient basis, meaning you go home the same day as surgery. These procedures provide almost complete relief of all symptoms in 95% of patients. Expect at least 2 weeks for healing after surgery. For cases that are the result of repeated jolting or shaking of the hand or wrist or prolonged hyperextension, surgery is not usually recommended because stretching of the median nerve, not compression, is usually the cause of carpal tunnel syndrome in these cases. MEDICATION   If pain medication is necessary, nonsteroidal anti-inflammatory medications, such as aspirin and ibuprofen, or other minor pain relievers, such as acetaminophen, are often recommended.  Do not take pain medication for 7 days before surgery.  Prescription pain relievers are usually only prescribed after surgery. Use only as directed and only as much as you  need.  Corticosteroid injections may be given to reduce inflammation. However, they are not always recommended.  Vitamin B6 (pyridoxine) may reduce symptoms; use only if prescribed for your disorder. SEEK MEDICAL CARE IF:   Symptoms get worse or do not improve in 2 weeks despite treatment.  You also have a current or recent history of neck or shoulder injury that has resulted in pain or tingling elsewhere in your arm. Document Released: 08/20/2005 Document Revised: 01/04/2014 Document Reviewed: 12/02/2008 [facility] Patient Information 2015 [place], [place]. This information is not intended to replace advice given to you by your health care Shantoria Ellwood. Make sure you discuss any questions you have with your health care Yamira Papa.

## 2015-05-29 NOTE — Progress Notes (Signed)
Subjective:    Patient ID: Steve Graham, male    DOB: 04-29-1956, 59 y.o.   MRN: [id number] This chart was scribed for Steve Sorenson, MD by Littie Deeds, Medical Scribe. This patient was seen in Room 1 and the patient's care was started at 9:33 AM.   Chief Complaint  Patient presents with  . numbness in fingers    rt. x 3 weeks  . Shoulder Pain    rt.     HPI HPI Comments: [name] is a 59 y.o. male who presents to the [facility] complaining of intermittent numbness to the fingertips of his right 1st, 2nd and 3rd fingers that started 3 weeks ago. He notes that his work involves a lot of repetitive motions. Patient also reports having right shoulder pain. He believes he has bursitis in his shoulder. The shoulder pain is worse when raising his shoulders. He has not tried any medications for his symptoms. He did have bursitis in his shoulder about 12-15 years ago, but he did not have any numbness at that time; this was treated with an injection with relief.   Patient has been complaining of a recent episode of dizziness, sudden onset, non-positional. No vertigo. This lasted approximately 10 minutes. Is associated with bilateral blurry vision. He had a recent echo which showed EF 60-65%, consistent with grade 1 diastolic dysfuction, as well as mildly dilated aortic root of 38 mm. Carotid dopplers were ordered but not yet done. He was started on pravastatin around a month ago. EKG showed bradycardia with concern for LVH.  Past Medical History  Diagnosis Date  . Allergy   . Hyperlipidemia   . Hemorrhoids, external   . Hemorrhoids, internal   . Colon polyp 11/02/2007    sessile polyp, external hemorrhoids.  Repeat in 5-10 years. Hung.   Current Outpatient Prescriptions on File Prior to Visit  Medication Sig Dispense Refill  . aspirin 81 MG tablet Take 81 mg by mouth daily.    . cycloSPORINE (RESTASIS) 0.05 % ophthalmic emulsion 1 drop 2 (two) times daily.    . Multiple  Vitamins-Minerals (MULTIVITAMIN WITH MINERALS) tablet Take 1 tablet by mouth daily.    . pravastatin (PRAVACHOL) 40 MG tablet Take 1 tablet (40 mg total) by mouth daily. 90 tablet 3  . co-enzyme Q-10 30 MG capsule Take 30 mg by mouth 3 (three) times daily.    Marland Kitchen omeprazole (PRILOSEC) 20 MG capsule Take 1 capsule (20 mg total) by mouth daily. (Patient not taking: Reported on 04/17/2015) 30 capsule 1   No current facility-administered medications on file prior to visit.   No Known Allergies  Review of Systems  Constitutional: Negative for fever, chills, diaphoresis, activity change and appetite change.  Cardiovascular: Negative for leg swelling.  Musculoskeletal: Positive for myalgias and arthralgias. Negative for back pain, joint swelling and gait problem.  Skin: Negative for color change, rash and wound.  Neurological: Positive for numbness. Negative for dizziness, weakness, light-headedness and headaches.  Hematological: Negative for adenopathy. Does not bruise/bleed easily.  Psychiatric/Behavioral: Negative for sleep disturbance.       Objective:  BP 128/80 mmHg  Pulse 64  Temp(Src) 98.2 F (36.8 C) (Oral)  Resp 16  Ht  (1.778 m)  Wt 208 lb (94.348 kg)  BMI 29.84 kg/m2  SpO2 98%  Physical Exam  Constitutional: He is oriented to person, place, and time. He appears well-developed and well-nourished. No distress.  HENT:  Head: Normocephalic and atraumatic.  Mouth/Throat:  Oropharynx is clear and moist. No oropharyngeal exudate.  Eyes: Pupils are equal, round, and reactive to light.  Neck: Neck supple.  Cardiovascular: Normal rate.   Pulmonary/Chest: Effort normal.  Musculoskeletal: He exhibits no edema.  Right bony shoulder is normal. No AC joint enlargement. Normal acromion and clavicular joint. Full ROM.  Neurological: He is alert and oriented to person, place, and time. No cranial nerve deficit.  Reflex Scores:      Tricep reflexes are 1+ on the right side.      Bicep  reflexes are 1+ on the right side.      Brachioradialis reflexes are 1+ on the right side. Positive Tinel's on the right. Positive Phalen's on the right. 5/5 grasp in opposition. 5/5 strength in biceps, triceps and deltoid. 5/5 in rotator cuff except for strength on external rotation on the right, which is 4+/5. Full ROM. Negative Neer's. Negative Hawkins.  Skin: Skin is warm and dry. No rash noted.  Psychiatric: He has a normal mood and affect. His behavior is normal.  Nursing note and vitals reviewed.         Assessment & Plan:   1. Carpal tunnel syndrome of right wrist   2. Acute shoulder bursitis, right   start night time cock-up wrist splint. Pt had cortisone inj into subacromial bursa sev yrs ago with excellent results and was wanting to receive intraarticular injection again today but will try oral prednisone first to see if that will also help his carpal tunne. If he is still having shoulder pain, fast pass card given to see me for quick shoulder cortisone inj.  Meds ordered this encounter  Medications  . predniSONE (DELTASONE) 20 MG tablet    Sig: Take 3 tabs po qd x 3d, 2 tabs po qd x 3d, 1 tab po qd x 3d    Dispense:  18 tablet    Refill:  0  . meloxicam (MOBIC) 15 MG tablet    Sig: Take 1 tablet (15 mg total) by mouth daily.    Dispense:  30 tablet    Refill:  1    I personally performed the services described in this documentation, which was scribed in my presence. The recorded information has been reviewed and considered, and addended by me as needed.  Steve Sorenson, MD MPH   By signing my name below, I, Littie Deeds, attest that this documentation has been prepared under the direction and in the presence of Steve Sorenson, MD.  Electronically Signed: Littie Deeds, Medical Scribe. 05/29/2015. 9:33 AM.

## 2015-07-13 ENCOUNTER — Ambulatory Visit (INDEPENDENT_AMBULATORY_CARE_PROVIDER_SITE_OTHER): Payer: BLUE CROSS/BLUE SHIELD | Admitting: Emergency Medicine

## 2015-07-13 VITALS — BP 126/80 | HR 70 | Temp 98.3°F | Resp 16 | Ht 70.5 in | Wt 211.6 lb

## 2015-07-13 DIAGNOSIS — M7551 Bursitis of right shoulder: Secondary | ICD-10-CM | POA: Diagnosis not present

## 2015-07-13 DIAGNOSIS — G5601 Carpal tunnel syndrome, right upper limb: Secondary | ICD-10-CM

## 2015-07-13 MED ORDER — METHYLPREDNISOLONE ACETATE 80 MG/ML IJ SUSP: 80.0000 mg | Freq: Once | INTRAMUSCULAR | Status: DC

## 2015-07-13 MED ORDER — NAPROXEN SODIUM 550 MG PO TABS
550.0000 mg | ORAL_TABLET | Freq: Two times a day (BID) | ORAL | Status: DC
Start: 2015-07-13 — End: 2015-12-06

## 2015-07-13 NOTE — Patient Instructions (Signed)
Bursitis °Bursitis is inflammation and irritation of a bursa, which is one of the small, fluid-filled sacs that cushion and protect the moving parts of your body. These sacs are located between bones and muscles, muscle attachments, or skin areas next to bones. A bursa protects these structures from the wear and tear that results from frequent movement. °An inflamed bursa causes pain and swelling. Fluid may build up inside the sac. Bursitis is most common near joints, especially the knees, elbows, hips, and shoulders. °CAUSES °Bursitis can be caused by:  °· Injury from: °¨ A direct blow, like falling on your knee or elbow. °¨ Overuse of a joint (repetitive stress). °· Infection. This can happen if bacteria gets into a bursa through a cut or scrape near a joint. °· Diseases that cause joint inflammation, such as gout and rheumatoid arthritis. °RISK FACTORS °You may be at risk for bursitis if you:  °· Have a job or hobby that involves a lot of repetitive stress on your joints. °· Have a condition that weakens your body's defense system (immune system), such as diabetes, cancer, or HIV. °· Lift and reach overhead often. °· Kneel or lean on hard surfaces often. °· Run or walk often. °SIGNS AND SYMPTOMS °The most common signs and symptoms of bursitis are: °· Pain that gets worse when you move the affected body part or put weight on it. °· Inflammation. °· Stiffness. °Other signs and symptoms may include: °· Redness. °· Tenderness. °· Warmth. °· Pain that continues after rest. °· Fever and chills. This may occur in bursitis caused by infection. °DIAGNOSIS °Bursitis may be diagnosed by:  °· Medical history and physical exam. °· MRI. °· A procedure to drain fluid from the bursa with a needle (aspiration). The fluid may be checked for signs of infection or gout. °· Blood tests to rule out other causes of inflammation. °TREATMENT  °Bursitis can usually be treated at home with rest, ice, compression, and elevation (RICE). For  mild bursitis, RICE treatment may be all you need. Other treatments may include: °· Nonsteroidal anti-inflammatory drugs (NSAIDs) to treat pain and inflammation. °· Corticosteroids to fight inflammation. You may have these drugs injected into and around the area of bursitis. °· Aspiration of bursitis fluid to relieve pain and improve movement. °· Antibiotic medicine to treat an infected bursa. °· A splint, brace, or walking aid. °· Physical therapy if you continue to have pain or limited movement. °· Surgery to remove a damaged or infected bursa. This may be needed if you have a very bad case of bursitis or if other treatments have not worked. °HOME CARE INSTRUCTIONS  °· Take medicines only as directed by your health care provider. °· If you were prescribed an antibiotic medicine, finish it all even if you start to feel better. °· Rest the affected area as directed by your health care provider. °¨ Keep the area elevated. °¨ Avoid activities that make pain worse. °· Apply ice to the injured area: °¨ Place ice in a plastic bag. °¨ Place a towel between your skin and the bag. °¨ Leave the ice on for 20 minutes, 2-3 times a day. °· Use splints, braces, pads, or walking aids as directed by your health care provider. °· Keep all follow-up visits as directed by your health care provider. This is important. °PREVENTION  °· Wear knee pads if you kneel often. °· Wear sturdy running or walking shoes that fit you well. °· Take regular breaks from repetitive activity. °· Warm   up by stretching before doing any strenuous activity. °· Maintain a healthy weight or lose weight as recommended by your health care provider. Ask your health care provider if you need help. °· Exercise regularly. Start any new physical activity gradually. °SEEK MEDICAL CARE IF:  °· Your bursitis is not responding to treatment or home care. °· You have a fever. °· You have chills. °  °This information is not intended to replace advice given to you by your  health care provider. Make sure you discuss any questions you have with your health care provider. °  °Document Released: 08/17/2000 Document Revised: 05/11/2015 Document Reviewed: 11/09/2013 °Elsevier Interactive Patient Education ©2016 Elsevier Inc. ° °

## 2015-07-13 NOTE — Progress Notes (Signed)
Subjective:  Patient ID: Steve Graham, male    DOB: Oct 26, 1955  Age: 59 y.o. MRN: [id number]  CC: Shoulder Pain   HPI Steve Graham presents  he has pain in his right shoulder that was evaluated and treated in September by Dr. Clelia CroftShaw. He's had no improvement with the prednisone and has now come in for reevaluation. Been using his cock-up splint at night and has no improvement with the numbness and tingling and weakness that he had in his right hand. He has no history of injury. He does repetitive movement work with his hands at work  History Steve Graham has a past medical history of Allergy; Hyperlipidemia; Hemorrhoids, external; Hemorrhoids, internal; and Colon polyp (11/02/2007).   He has past surgical history that includes Bunionectomy and Colonoscopy w/ polypectomy (09/03/2008).   His  family history includes Asthma in his son; Diabetes in his mother; Hypertension in his mother.  He   reports that he has never smoked. He has never used smokeless tobacco. He reports that he drinks alcohol. He reports that he does not use illicit drugs.  Outpatient Prescriptions Prior to Visit  Medication Sig Dispense Refill  . aspirin 81 MG tablet Take 81 mg by mouth daily.    Marland Kitchen. co-enzyme Q-10 30 MG capsule Take 30 mg by mouth 3 (three) times daily.    . cycloSPORINE (RESTASIS) 0.05 % ophthalmic emulsion 1 drop 2 (two) times daily.    . meloxicam (MOBIC) 15 MG tablet Take 1 tablet (15 mg total) by mouth daily. 30 tablet 1  . Multiple Vitamins-Minerals (MULTIVITAMIN WITH MINERALS) tablet Take 1 tablet by mouth daily.    . pravastatin (PRAVACHOL) 40 MG tablet Take 1 tablet (40 mg total) by mouth daily. 90 tablet 3  . predniSONE (DELTASONE) 20 MG tablet Take 3 tabs po qd x 3d, 2 tabs po qd x 3d, 1 tab po qd x 3d (Patient not taking: Reported on 07/13/2015) 18 tablet 0  . omeprazole (PRILOSEC) 20 MG capsule Take 1 capsule (20 mg total) by mouth daily. (Patient not taking: Reported on 04/17/2015) 30 capsule 1   No  facility-administered medications prior to visit.    Social History   Social History  . Marital Status: Married    Spouse Name: N/A  . Number of Children: N/A  . Years of Education: N/A   Social History Main Topics  . Smoking status: Never Smoker   . Smokeless tobacco: Never Used  . Alcohol Use: Yes     Comment: occ  . Drug Use: No  . Sexual Activity: Yes   Other Topics Concern  . None   Social History Narrative   Marital status: married x 23 years; happily married      Children:  2 boys; no grandchildren.      Lives: wife, 2 boys.      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        Education: college      Tobacco: never      Alcohol: two drinks per week.      Drugs: none      Exercises:  Two times per week for 30 minutes running, weight lifting.     Seatbelt:  100%      Guns: none              Review of Systems  Constitutional: Negative for fever, chills and appetite change.  HENT: Negative for congestion, ear pain, postnasal drip, sinus pressure and sore throat.   Eyes: Negative  for pain and redness.  Respiratory: Negative for cough, shortness of breath and wheezing.   Cardiovascular: Negative for leg swelling.  Gastrointestinal: Negative for nausea, vomiting, abdominal pain, diarrhea, constipation and blood in stool.  Endocrine: Negative for polyuria.  Genitourinary: Negative for dysuria, urgency, frequency and flank pain.  Musculoskeletal: Negative for gait problem.  Skin: Negative for rash.  Neurological: Negative for weakness and headaches.  Psychiatric/Behavioral: Negative for confusion and decreased concentration. The patient is not nervous/anxious.     Objective:  BP 126/80 mmHg  Pulse 70  Temp(Src) 98.3 F (36.8 C) (Oral)  Resp 16  Ht 5' 10.5" (1.791 m)  Wt 211 lb 9.6 oz (95.981 kg)  BMI 29.92 kg/m2  SpO2 98%  Physical Exam  Constitutional: He is oriented to person, place, and time. He appears well-developed and well-nourished.  HENT:    Head: Normocephalic and atraumatic.  Eyes: Conjunctivae are normal. Pupils are equal, round, and reactive to light.  Pulmonary/Chest: Effort normal.  Musculoskeletal: He exhibits no edema.       Right shoulder: He exhibits decreased range of motion and tenderness.  Neurological: He is alert and oriented to person, place, and time.  Skin: Skin is dry.  Psychiatric: He has a normal mood and affect. His behavior is normal. Thought content normal.      Assessment & Plan:   Steve Graham was seen today for shoulder pain.  Diagnoses and all orders for this visit:  Carpal tunnel syndrome of right wrist -     Ambulatory referral to Neurology  Shoulder bursitis, right -     methylPREDNISolone acetate (DEPO-MEDROL) injection 80 mg; Inject 1 mL (80 mg total) into the muscle once.  Other orders -     naproxen sodium (ANAPROX DS) 550 MG tablet; Take 1 tablet (550 mg total) by mouth 2 (two) times daily with a meal.   I have discontinued Steve Graham omeprazole. I am also having him start on naproxen sodium. Additionally, I am having him maintain his cycloSPORINE, co-enzyme Q-10, aspirin, multivitamin with minerals, pravastatin, predniSONE, and meloxicam. We will continue to administer methylPREDNISolone acetate.  Meds ordered this encounter  Medications  . methylPREDNISolone acetate (DEPO-MEDROL) injection 80 mg    Sig:   . naproxen sodium (ANAPROX DS) 550 MG tablet    Sig: Take 1 tablet (550 mg total) by mouth 2 (two) times daily with a meal.    Dispense:  40 tablet    Refill:  0   His right shoulder joint was injected with 40 mg Depo-Medrol half a cc of Marcaine he tolerated that well. We'll follow him back with his nerve conduction study and EMG.  Appropriate red flag conditions were discussed with the patient as well as actions that should be taken.  Patient expressed his understanding.  Follow-up: Return if symptoms worsen or fail to improve.  Carmelina Dane, MD

## 2015-08-30 ENCOUNTER — Other Ambulatory Visit: Payer: Self-pay | Admitting: *Deleted

## 2015-08-30 ENCOUNTER — Ambulatory Visit (INDEPENDENT_AMBULATORY_CARE_PROVIDER_SITE_OTHER): Payer: BLUE CROSS/BLUE SHIELD | Admitting: Neurology

## 2015-08-30 DIAGNOSIS — R200 Anesthesia of skin: Secondary | ICD-10-CM

## 2015-08-30 DIAGNOSIS — G5601 Carpal tunnel syndrome, right upper limb: Secondary | ICD-10-CM

## 2015-08-30 DIAGNOSIS — G5621 Lesion of ulnar nerve, right upper limb: Secondary | ICD-10-CM

## 2015-08-30 NOTE — Procedures (Signed)
[facility]  [address], [address]  [place], [place] [address] Tel: [phone]) [phone] Fax:  [phone] Test Date:  08/30/2015  Patient: Steve BuddsVental McVoy DOB: 1956/06/09 Physician: Steve Graham  Sex: Male Height: 5\' 10"  Ref Phys: Steve OdorAnderson, Steve Graham  ID#: [id number] Temp: 35.2C Technician:    Patient Complaints: This is a 59 year-old gentleman referred for evaluation of right arm and hand paresthesias.   NCV & EMG Findings: Extensive electrodiagnostic testing of the right upper extremity shows: 1. Right median and ulnar sensory responses are prolonged and reduced in amplitude. Right radial sensory responses within normal limits. 2. Right median motor responses within normal limits. Right ulnar motor response shows decreased conduction velocity across the elbow (A Elbow-B Elbow, 42 m/s).   3. Chronic motor axon loss changes were isolated to the right abductor pollicis brevis muscle, without accompanied active denervation.    Impression: 1. Right median neuropathy at or distal to the wrist, consistent with the clinical diagnosis of carpal tunnel syndrome. Overall, these findings are moderate in degree electrically. 2. 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   _____________________________ Steve Sickleonika Braddock Servellon, D.O.    Nerve Conduction Studies Anti Sensory Summary Table   Stim Site NR Peak (ms) Norm Peak (ms) P-T Amp (V) Norm P-T Amp  Right Median Anti Sensory (2nd Digit)  Wrist    4.6 <3.6 7.6 >15  Right Radial Anti Sensory (Base 1st Digit)  Wrist    2.4 <2.7 15.6 >14  Right Ulnar Anti Sensory (5th Digit)  Wrist    3.2 <3.1 5.9 >10   Motor Summary Table   Stim Site NR Onset (ms) Norm Onset (ms) O-P Amp (mV) Norm O-P Amp Site1 Site2 Delta-0 (ms) Dist (cm) Vel (m/s) Norm Vel (m/s)  Right Median Motor (Abd Poll Brev)  Wrist    4.0 <4.0 6.9 >6 Elbow Wrist 5.5 34.0 62 >50  Elbow    9.5  5.9         Right Ulnar Motor (Abd Dig  Minimi)  Wrist    2.7 <3.1 11.5 >7 B Elbow Wrist 4.3 26.0 60 >50  B Elbow    7.0  9.7  A Elbow B Elbow 2.4 10.0 42 >50  A Elbow    9.4  9.2          EMG   Side Muscle Ins Act Fibs Psw Fasc Number Recrt Dur Dur. Amp Amp. Poly Poly. Comment  Right 1stDorInt Nml Nml Nml Nml Nml Nml Nml Nml Nml Nml Nml Nml N/A  Right Abd Poll Brev Nml Nml Nml Nml 1- Rapid Some 1+ Few 1+ Nml Nml N/A  Right Ext Indicis Nml Nml Nml Nml Nml Nml Nml Nml Nml Nml Nml Nml N/A  Right PronatorTeres Nml Nml Nml Nml Nml Nml Nml Nml Nml Nml Nml Nml N/A  Right Biceps Nml Nml Nml Nml Nml Nml Nml Nml Nml Nml Nml Nml N/A  Right Triceps Nml Nml Nml Nml Nml Nml Nml Nml Nml Nml Nml Nml N/A  Right Deltoid Nml Nml Nml Nml Nml Nml Nml Nml Nml Nml Nml Nml N/A  Right FlexDigProf 4,5 Nml Nml Nml Nml Nml Nml Nml Nml Nml Nml Nml Nml N/A      Waveforms:

## 2015-12-05 ENCOUNTER — Ambulatory Visit: Payer: Self-pay

## 2015-12-06 ENCOUNTER — Ambulatory Visit (INDEPENDENT_AMBULATORY_CARE_PROVIDER_SITE_OTHER): Payer: Managed Care, Other (non HMO) | Admitting: Family Medicine

## 2015-12-06 VITALS — BP 122/72 | HR 72 | Temp 97.6°F | Resp 16 | Ht 70.0 in | Wt 204.4 lb

## 2015-12-06 DIAGNOSIS — M7711 Lateral epicondylitis, right elbow: Secondary | ICD-10-CM | POA: Diagnosis not present

## 2015-12-06 DIAGNOSIS — G5621 Lesion of ulnar nerve, right upper limb: Secondary | ICD-10-CM | POA: Diagnosis not present

## 2015-12-06 DIAGNOSIS — G5601 Carpal tunnel syndrome, right upper limb: Secondary | ICD-10-CM

## 2015-12-06 MED ORDER — DICLOFENAC SODIUM 1 % TD GEL: 4.0000 g | Freq: Four times a day (QID) | TRANSDERMAL | Status: DC

## 2015-12-06 NOTE — Progress Notes (Addendum)
Subjective:  By signing my name below, I, Steve Graham, attest that this documentation has been prepared under the direction and in the presence of Steve SorensonEva Shaw, MD Electronically Signed: Charline BillsEssence Graham, ED Scribe 12/06/2015 at 2:08 PM.   Patient ID: Steve Graham, male    DOB: 15-Mar-1956, 60 y.o.   MRN: [id number]  Chief Complaint  Patient presents with  . Arm Pain    right   HPI HPI Comments: Steve Graham is a 60 y.o. male who presents to the [facility] complaining of persistent right arm pain for several months. Pt has an extensive h/o of carpal tunnel in right wrist for the past 6 months. He saw neuro for NCV which confirmed right carpal tunnel, moderate, as well as demyelinated right ulnar neuropathy. Also had a right shoulder bursitis with prednisone and a night wrist splint with no improvement. 5 months ago his shoulder was injected with 80 mg of DepoMedrol and started on Naproxen. He has not had any imaging of his shoulder.   Today, he states that he is still experiencing right lateral forearm pain for several months. Pt reports associated numbness in his right forth finger. He states that Prednisone and wrist splint did not provide significant relief of wrist pain so he has not been treating symptoms. However, pt reports that right shoulder pain has resolved since receiving an injection 5 months ago.   Pt works for a [employer], [employer]. Pt plays tennis but has not played in several months due to the weather.   Past Medical History  Diagnosis Date  . Allergy   . Hyperlipidemia   . Hemorrhoids, external   . Hemorrhoids, internal   . Colon polyp 11/02/2007    sessile polyp, external hemorrhoids.  Repeat in 5-10 years. Steve Graham.   Current Outpatient Prescriptions on File Prior to Visit  Medication Sig Dispense Refill  . aspirin 81 MG tablet Take 81 mg by mouth daily.    Marland Kitchen. co-enzyme Q-10 30 MG capsule Take 30 mg by mouth 3 (three) times daily.    .  cycloSPORINE (RESTASIS) 0.05 % ophthalmic emulsion 1 drop 2 (two) times daily.    . Multiple Vitamins-Minerals (MULTIVITAMIN WITH MINERALS) tablet Take 1 tablet by mouth daily.    . meloxicam (MOBIC) 15 MG tablet Take 1 tablet (15 mg total) by mouth daily. (Patient not taking: Reported on 12/06/2015) 30 tablet 1  . naproxen sodium (ANAPROX DS) 550 MG tablet Take 1 tablet (550 mg total) by mouth 2 (two) times daily with a meal. (Patient not taking: Reported on 12/06/2015) 40 tablet 0  . pravastatin (PRAVACHOL) 40 MG tablet Take 1 tablet (40 mg total) by mouth daily. (Patient not taking: Reported on 12/06/2015) 90 tablet 3  . predniSONE (DELTASONE) 20 MG tablet Take 3 tabs po qd x 3d, 2 tabs po qd x 3d, 1 tab po qd x 3d (Patient not taking: Reported on 07/13/2015) 18 tablet 0   Current Facility-Administered Medications on File Prior to Visit  Medication Dose Route Frequency Provider Last Rate Last Dose  . methylPREDNISolone acetate (DEPO-MEDROL) injection 80 mg  80 mg Intramuscular Once Carmelina DaneJeffery S Anderson, MD       No Known Allergies  Review of Systems  Constitutional: Negative for fever and activity change.  Cardiovascular: Negative for chest pain and leg swelling.  Gastrointestinal: Negative for nausea, vomiting and abdominal pain.  Musculoskeletal: Positive for myalgias and arthralgias. Negative for back pain, joint swelling, gait problem, neck pain  and neck stiffness.  Skin: Negative for color change, pallor and rash.  Neurological: Positive for numbness. Negative for weakness.  Hematological: Negative for adenopathy. Does not bruise/bleed easily.  BP 122/72 mmHg  Pulse 72  Temp(Src) 97.6 F (36.4 C) (Oral)  Resp 16  Ht  (1.778 m)  Wt 204 lb 6.4 oz (92.715 kg)  BMI 29.33 kg/m2  SpO2 98%    Objective:   Physical Exam  Constitutional: He is oriented to person, place, and time. He appears well-developed and well-nourished. No distress.  HENT:  Head: Normocephalic and atraumatic.    Eyes: Conjunctivae and EOM are normal.  Neck: Neck supple. No tracheal deviation present.  Cardiovascular: Normal rate.   Pulmonary/Chest: Effort normal. No respiratory distress.  Musculoskeletal: Normal range of motion.       Right elbow: He exhibits normal range of motion, no swelling, no effusion, no deformity and no laceration. Tenderness found. Lateral epicondyle tenderness noted. No medial epicondyle and no olecranon process tenderness noted.       Right wrist: Normal. He exhibits normal range of motion, no tenderness, no swelling and no effusion.  Mild tenderness to palpation over the R lateral epicondyle.  Pain increases with resisted wrist extension, none with resisted flexion or lateral rotation  Neurological: He is alert and oriented to person, place, and time.  Skin: Skin is warm and dry.  Psychiatric: He has a normal mood and affect. His behavior is normal.  Nursing note and vitals reviewed.     Assessment & Plan:   1. Ulnar neuropathy at elbow of right upper extremity - NCV/EMG was read as poss demyelinating disease of the ulnar nerve - pt does not seem to have any sxs but unknown etiology so refer to neuro for further eval of this - recommend waiting to see hand surg for CTS treatment until AFTER neurology eval  2. Carpal tunnel syndrome of right wrist - moderate on NCV/EMG at [facility] neuro 4 mos prior. Failed nightly wrist splints and oral prednisone and naids - refer to hand surg for injection and consideration of surgical release  3. Lateral epicondylitis (tennis elbow), right - start band at all times x 1 mo, try top voltaren gel    Orders Placed This Encounter  Procedures  . Ambulatory referral to Neurology    Referral Priority:  Routine    Referral Type:  Consultation    Referral Reason:  Specialty Services Required    Requested Specialty:  Neurology    Number of Visits Requested:  1  . Ambulatory referral to Hand Surgery    Referral Priority:  Routine     Referral Type:  Surgical    Referral Reason:  Specialty Services Required    Requested Specialty:  Hand Surgery    Number of Visits Requested:  1    Meds ordered this encounter  Medications  . diclofenac sodium (VOLTAREN) 1 % GEL    Sig: Apply 4 g topically 4 (four) times daily.    Dispense:  100 g    Refill:  1    I personally performed the services described in this documentation, which was scribed in my presence. The recorded information has been reviewed and considered, and addended by me as needed.  Steve Sorenson, MD MPH

## 2015-12-06 NOTE — Patient Instructions (Addendum)
IF you received an x-ray today, you will receive an invoice from [facility]. Please contact [facility] at [phone] with questions or concerns regarding your invoice.   IF you received labwork today, you will receive an invoice from [employer]. Please contact [facility] at [phone] with questions or concerns regarding your invoice.   Our billing staff will not be able to assist you with questions regarding bills from these companies.  You will be contacted with the lab results as soon as they are available. The fastest way to get your results is to activate your My Chart account. Instructions are located on the last page of this paperwork. If you have not heard from [place] regarding the results in 2 weeks, please contact this office.    Lateral Epicondylitis With Rehab Lateral epicondylitis involves inflammation and pain around the outer portion of the elbow. The pain is caused by inflammation of the tendons in the forearm that bring back (extend) the wrist. Lateral epicondylitis is also called tennis elbow, because it is very common in tennis players. However, it may occur in any individual who extends the wrist repetitively. If lateral epicondylitis is left untreated, it may become a chronic problem. SYMPTOMS   Pain, tenderness, and inflammation on the outer (lateral) side of the elbow.  Pain or weakness with gripping activities.  Pain that increases with wrist-twisting motions (playing tennis, using a screwdriver, opening a door or a jar).  Pain with lifting objects, including a coffee cup. CAUSES  Lateral epicondylitis is caused by inflammation of the tendons that extend the wrist. Causes of injury may include:  Repetitive stress and strain on the muscles and tendons that extend the wrist.  Sudden change in activity level or intensity.  Incorrect grip in racquet sports.  Incorrect grip size of racquet (often too  large).  Incorrect hitting position or technique (usually backhand, leading with the elbow).  Using a racket that is too heavy. RISK INCREASES WITH:  Sports or occupations that require repetitive and/or strenuous forearm and wrist movements (tennis, squash, racquetball, carpentry).  Poor wrist and forearm strength and flexibility.  Failure to warm up properly before activity.  Resuming activity before healing, rehabilitation, and conditioning are complete. PREVENTION   Warm up and stretch properly before activity.  Maintain physical fitness:  Strength, flexibility, and endurance.  Cardiovascular fitness.  Wear and use properly fitted equipment.  Learn and use proper technique and have a coach correct improper technique.  Wear a tennis elbow (counterforce) brace. PROGNOSIS  The course of this condition depends on the degree of the injury. If treated properly, acute cases (symptoms lasting less than 4 weeks) are often resolved in 2 to 6 weeks. Chronic (longer lasting cases) often resolve in 3 to 6 months but may require physical therapy. RELATED COMPLICATIONS   Frequently recurring symptoms, resulting in a chronic problem. Properly treating the problem the first time decreases frequency of recurrence.  Chronic inflammation, scarring tendon degeneration, and partial tendon tear, requiring surgery.  Delayed healing or resolution of symptoms. TREATMENT  Treatment first involves the use of ice and medicine to reduce pain and inflammation. Strengthening and stretching exercises may help reduce discomfort if performed regularly. These exercises may be performed at home if the condition is an acute injury. Chronic cases may require a referral to a physical therapist for evaluation and treatment. Your caregiver may advise a corticosteroid injection to help reduce inflammation. Rarely, surgery is needed. MEDICATION  If pain medicine is  needed, nonsteroidal anti-inflammatory medicines  (aspirin and ibuprofen), or other minor pain relievers (acetaminophen), are often advised.  Do not take pain medicine for 7 days before surgery.  Prescription pain relievers may be given, if your caregiver thinks they are needed. Use only as directed and only as much as you need.  Corticosteroid injections may be recommended. These injections should be reserved only for the most severe cases, because they can only be given a certain number of times. HEAT AND COLD  Cold treatment (icing) should be applied for 10 to 15 minutes every 2 to 3 hours for inflammation and pain, and immediately after activity that aggravates your symptoms. Use ice packs or an ice massage.  Heat treatment may be used before performing stretching and strengthening activities prescribed by your caregiver, physical therapist, or athletic trainer. Use a heat pack or a warm water soak. SEEK MEDICAL CARE IF: Symptoms get worse or do not improve in 2 weeks, despite treatment. EXERCISES  RANGE OF MOTION (ROM) AND STRETCHING EXERCISES - Epicondylitis, Lateral (Tennis Elbow) These exercises may help you when beginning to rehabilitate your injury. Your symptoms may go away with or without further involvement from your physician, physical therapist, or athletic trainer. While completing these exercises, remember:   Restoring tissue flexibility helps normal motion to return to the joints. This allows healthier, less painful movement and activity.  An effective stretch should be held for at least 30 seconds.  A stretch should never be painful. You should only feel a gentle lengthening or release in the stretched tissue. RANGE OF MOTION - Wrist Flexion, Active-Assisted  Extend your right / left elbow with your fingers pointing down.*  Gently pull the back of your hand towards you, until you feel a gentle stretch on the top of your forearm.  Hold this position for __________ seconds. Repeat __________ times. Complete this  exercise __________ times per day.  *If directed by your physician, physical therapist or athletic trainer, complete this stretch with your elbow bent, rather than extended. RANGE OF MOTION - Wrist Extension, Active-Assisted  Extend your right / left elbow and turn your palm upwards.*  Gently pull your palm and fingertips back, so your wrist extends and your fingers point more toward the ground.  You should feel a gentle stretch on the inside of your forearm.  Hold this position for __________ seconds. Repeat __________ times. Complete this exercise __________ times per day. *If directed by your physician, physical therapist or athletic trainer, complete this stretch with your elbow bent, rather than extended. STRETCH - Wrist Flexion  Place the back of your right / left hand on a tabletop, leaving your elbow slightly bent. Your fingers should point away from your body.  Gently press the back of your hand down onto the table by straightening your elbow. You should feel a stretch on the top of your forearm.  Hold this position for __________ seconds. Repeat __________ times. Complete this stretch __________ times per day.  STRETCH - Wrist Extension   Place your right / left fingertips on a tabletop, leaving your elbow slightly bent. Your fingers should point backwards.  Gently press your fingers and palm down onto the table by straightening your elbow. You should feel a stretch on the inside of your forearm.  Hold this position for __________ seconds. Repeat __________ times. Complete this stretch __________ times per day.  STRENGTHENING EXERCISES - Epicondylitis, Lateral (Tennis Elbow) These exercises may help you when beginning to rehabilitate your injury. They may  resolve your symptoms with or without further involvement from your physician, physical therapist, or athletic trainer. While completing these exercises, remember:   Muscles can gain both the endurance and the strength  needed for everyday activities through controlled exercises.  Complete these exercises as instructed by your physician, physical therapist or athletic trainer. Increase the resistance and repetitions only as guided.  You may experience muscle soreness or fatigue, but the pain or discomfort you are trying to eliminate should never worsen during these exercises. If this pain does get worse, stop and make sure you are following the directions exactly. If the pain is still present after adjustments, discontinue the exercise until you can discuss the trouble with your caregiver. STRENGTH - Wrist Flexors  Sit with your right / left forearm palm-up and fully supported on a table or countertop. Your elbow should be resting below the height of your shoulder. Allow your wrist to extend over the edge of the surface.  Loosely holding a __________ weight, or a piece of rubber exercise band or tubing, slowly curl your hand up toward your forearm.  Hold this position for __________ seconds. Slowly lower the wrist back to the starting position in a controlled manner. Repeat __________ times. Complete this exercise __________ times per day.  STRENGTH - Wrist Extensors  Sit with your right / left forearm palm-down and fully supported on a table or countertop. Your elbow should be resting below the height of your shoulder. Allow your wrist to extend over the edge of the surface.  Loosely holding a __________ weight, or a piece of rubber exercise band or tubing, slowly curl your hand up toward your forearm.  Hold this position for __________ seconds. Slowly lower the wrist back to the starting position in a controlled manner. Repeat __________ times. Complete this exercise __________ times per day.  STRENGTH - Ulnar Deviators  Stand with a ____________________ weight in your right / left hand, or sit while holding a rubber exercise band or tubing, with your healthy arm supported on a table or countertop.  Move  your wrist, so that your pinkie travels toward your forearm and your thumb moves away from your forearm.  Hold this position for __________ seconds and then slowly lower the wrist back to the starting position. Repeat __________ times. Complete this exercise __________ times per day STRENGTH - Radial Deviators  Stand with a ____________________ weight in your right / left hand, or sit while holding a rubber exercise band or tubing, with your injured arm supported on a table or countertop.  Raise your hand upward in front of you or pull up on the rubber tubing.  Hold this position for __________ seconds and then slowly lower the wrist back to the starting position. Repeat __________ times. Complete this exercise __________ times per day. STRENGTH - Forearm Supinators   Sit with your right / left forearm supported on a table, keeping your elbow below shoulder height. Rest your hand over the edge, palm down.  Gently grip a hammer or a soup ladle.  Without moving your elbow, slowly turn your palm and hand upward to a "thumbs-up" position.  Hold this position for __________ seconds. Slowly return to the starting position. Repeat __________ times. Complete this exercise __________ times per day.  STRENGTH - Forearm Pronators   Sit with your right / left forearm supported on a table, keeping your elbow below shoulder height. Rest your hand over the edge, palm up.  Gently grip a hammer or a soup  ladle.  Without moving your elbow, slowly turn your palm and hand upward to a "thumbs-up" position.  Hold this position for __________ seconds. Slowly return to the starting position. Repeat __________ times. Complete this exercise __________ times per day.  STRENGTH - Grip  Grasp a tennis ball, a dense sponge, or a large, rolled sock in your hand.  Squeeze as hard as you can, without increasing any pain.  Hold this position for __________ seconds. Release your grip slowly. Repeat __________  times. Complete this exercise __________ times per day.  STRENGTH - Elbow Extensors, Isometric  Stand or sit upright, on a firm surface. Place your right / left arm so that your palm faces your stomach, and it is at the height of your waist.  Place your opposite hand on the underside of your forearm. Gently push up as your right / left arm resists. Push as hard as you can with both arms, without causing any pain or movement at your right / left elbow. Hold this stationary position for __________ seconds. Gradually release the tension in both arms. Allow your muscles to relax completely before repeating.   This information is not intended to replace advice given to you by your health care provider. Make sure you discuss any questions you have with your health care provider.   Document Released: 08/20/2005 Document Revised: 09/10/2014 Document Reviewed: 12/02/2008 Elsevier Interactive Patient Education 2016 [employer]  Carpal Tunnel Syndrome Carpal tunnel syndrome is a condition that causes pain in your hand and arm. The carpal tunnel is a narrow area located on the palm side of your wrist. Repeated wrist motion or certain diseases may cause swelling within the tunnel. This swelling pinches the main nerve in the wrist (median nerve). CAUSES  This condition may be caused by:   Repeated wrist motions.  Wrist injuries.  Arthritis.  A cyst or tumor in the carpal tunnel.  Fluid buildup during pregnancy. Sometimes the cause of this condition is not known.  RISK FACTORS This condition is more likely to develop in:   People who have jobs that cause them to repeatedly move their wrists in the same motion, such as butchers and cashiers.  Women.  People with certain conditions, such as:  Diabetes.  Obesity.  An underactive thyroid (hypothyroidism).  Kidney failure. SYMPTOMS  Symptoms of this condition include:   A tingling feeling in your fingers, especially in your thumb,  index, and middle fingers.  Tingling or numbness in your hand.  An aching feeling in your entire arm, especially when your wrist and elbow are bent for long periods of time.  Wrist pain that goes up your arm to your shoulder.  Pain that goes down into your palm or fingers.  A weak feeling in your hands. You may have trouble grabbing and holding items. Your symptoms may feel worse during the night.  DIAGNOSIS  This condition is diagnosed with a medical history and physical exam. You may also have tests, including:   An electromyogram (EMG). This test measures electrical signals sent by your nerves into the muscles.  X-rays. TREATMENT  Treatment for this condition includes:  Lifestyle changes. It is important to stop doing or modify the activity that caused your condition.  Physical or occupational therapy.  Medicines for pain and inflammation. This may include medicine that is injected into your wrist.  A wrist splint.  Surgery. HOME CARE INSTRUCTIONS  If You Have a Splint:  Wear it as told by your health care provider.  Remove it only as told by your health care provider.  Loosen the splint if your fingers become numb and tingle, or if they turn cold and blue.  Keep the splint clean and dry. General Instructions  Take over-the-counter and prescription medicines only as told by your health care provider.  Rest your wrist from any activity that may be causing your pain. If your condition is work related, talk to your employer about changes that can be made, such as getting a wrist pad to use while typing.  If directed, apply ice to the painful area:  Put ice in a plastic bag.  Place a towel between your skin and the bag.  Leave the ice on for 20 minutes, 2-3 times per day.  Keep all follow-up visits as told by your health care provider. This is important.  Do any exercises as told by your health care provider, physical therapist, or occupational therapist. SEEK  MEDICAL CARE IF:   You have new symptoms.  Your pain is not controlled with medicines.  Your symptoms get worse.   This information is not intended to replace advice given to you by your health care provider. Make sure you discuss any questions you have with your health care provider.   Document Released: 08/17/2000 Document Revised: 05/11/2015 Document Reviewed: 01/05/2015 Elsevier Interactive Patient Education [employer]

## 2015-12-30 ENCOUNTER — Encounter: Payer: Self-pay | Admitting: Neurology

## 2015-12-30 ENCOUNTER — Ambulatory Visit (INDEPENDENT_AMBULATORY_CARE_PROVIDER_SITE_OTHER): Payer: Self-pay | Admitting: Neurology

## 2015-12-30 VITALS — BP 140/84 | HR 58 | Ht 71.0 in | Wt 205.2 lb

## 2015-12-30 DIAGNOSIS — G5621 Lesion of ulnar nerve, right upper limb: Secondary | ICD-10-CM

## 2015-12-30 DIAGNOSIS — M7711 Lateral epicondylitis, right elbow: Secondary | ICD-10-CM

## 2015-12-30 NOTE — Progress Notes (Signed)
[facility] Clinic Note - Initial Visit   Date: 12/30/2015  Steve Graham MRN: [id number] DOB: Mar 11, 1956   Dear Dr. Clelia CroftShaw:  Thank you for your kind referral of Steve Graham for consultation of right arm pain and numbness. Although his history is well known to you, please allow us to reiterate it for the purpose of our medical record. The patient was accompanied to the clinic by self.    History of Present Illness: Steve Graham is a 60 y.o. right-handed African American male with hyperlipidemia presenting for evaluation of right arm pain and hand numbness.    Starting around October 2016, he developed achy pain of the right shoulder, elbow, and hand. 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  He also developed numbness over the last two digits on the right, but denies shooting pain down his forearm.  His right shoulder pain was treated as bursitis and NSAIDs which resolved his shoulder pain, but there is nothing that has helped his elbow pain.    He has constant pain over the lateral elbow, described as dull and achy which is tender with compression.  There has been no benefit with using a elbow sleeve or NSAIDs.  He does not have radiating electrical or sharp pain into his forearm or hand.  He has constant numbness over the tip of his right 4th digit.  He denies hand weakness or neck pain. He does not have paresthesias of the first three fingers.   NCS/EMG performed by myself in December showed moderate right CTS and ulnar neuropathy at the elbow.    He has noticed a black skin lesion appear over his right shoulder.    Out-side paper records, electronic medical record, and images have been reviewed where available and summarized as:  NCS/EMG of the right upper extremity 08/30/2015:  1. Right median neuropathy at  or distal to the wrist, consistent with the clinical diagnosis of carpal tunnel syndrome. Overall, these findings are moderate in degree electrically. 2. 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   Past Medical History  Diagnosis Date  . Allergy   . Hyperlipidemia   . Hemorrhoids, external   . Hemorrhoids, internal   . Colon polyp 11/02/2007    sessile polyp, external hemorrhoids.  Repeat in 5-10 years. Hung.    Past Surgical History  Procedure Laterality Date  . Bunionectomy    . Colonoscopy w/ polypectomy  09/03/2008    colon polyps. repeat in 5 years. [address]     Medications:  Outpatient Encounter Prescriptions as of 12/30/2015  Medication Sig  . aspirin 81 MG tablet Take 81 mg by mouth daily.  Marland Kitchen. co-enzyme Q-10 30 MG capsule Take 30 mg by mouth 3 (three) times daily.  . cycloSPORINE (RESTASIS) 0.05 % ophthalmic emulsion 1 drop 2 (two) times daily.  . diclofenac sodium (VOLTAREN) 1 % GEL Apply 4 g topically 4 (four) times daily.  . Multiple Vitamins-Minerals (MULTIVITAMIN WITH MINERALS) tablet Take 1 tablet by mouth daily.  . [DISCONTINUED] pravastatin (PRAVACHOL) 40 MG tablet Take 1 tablet (40 mg total) by mouth daily. (Patient not taking: Reported on 12/06/2015)   Facility-Administered Encounter Medications as of 12/30/2015  Medication  . methylPREDNISolone acetate (DEPO-MEDROL) injection 80 mg     Allergies: No Known Allergies  Family History: Family History  Problem Relation Age of  Onset  . Diabetes Mother   . Hypertension Mother   . Asthma Son   . Other Father     Deceased, gunshot wound  . Diabetes Sister     Social History: Social History  Substance Use Topics  . Smoking status: Never Smoker   . Smokeless tobacco: Never Used  . Alcohol Use: 0.0 oz/week    0 Standard drinks or equivalent per week     Comment: Socially 1-2 times per month   Social History   Social History Narrative   Marital status:  married x 23 years; happily married      Children:  2 boys; no grandchildren.      Lives: wife, 2 boys. 2 story home.      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       Education: college      Tobacco: never      Alcohol: two drinks per week.      Drugs: none      Exercises:  Two times per week for 30 minutes running, weight lifting.     Seatbelt:  100%      Guns: none             Review of Systems:  CONSTITUTIONAL: No fevers, chills, night sweats, or weight loss.   EYES: No visual changes or eye pain ENT: No hearing changes.  No history of nose bleeds.   RESPIRATORY: No cough, wheezing and shortness of breath.   CARDIOVASCULAR: Negative for chest pain, and palpitations.   GI: Negative for abdominal discomfort, blood in stools or black stools.  No recent change in bowel habits.   GU:  No history of incontinence.   MUSCLOSKELETAL: +history of joint pain or swelling.  No myalgias.   SKIN: Negative for lesions, rash, and itching.   HEMATOLOGY/ONCOLOGY: Negative for prolonged bleeding, bruising easily, and swollen nodes.  No history of cancer.   ENDOCRINE: Negative for cold or heat intolerance, polydipsia or goiter.   PSYCH:  No depression or anxiety symptoms.   NEURO: As Above.   Vital Signs:  BP 140/84 mmHg  Pulse 58  Ht  (1.803 m)  Wt 205 lb 3 oz (93.072 kg)  BMI 28.63 kg/m2  SpO2 97%   General Medical Exam:   General:  Well appearing, comfortable.   Eyes/ENT: see cranial nerve examination.   Neck: No masses appreciated.  Full range of motion without tenderness.  No carotid bruits. Respiratory:  Clear to auscultation, good air entry bilaterally.   Cardiac:  Regular rate and rhythm, no murmur.   Extremities:  No deformities, edema, or skin discoloration. Point tenderness over the right lateral epicondyle.  Skin:  Hyperpigmented macular skin lesion over the right upper shoulder.   Neurological Exam: MENTAL STATUS including orientation to time, place, person,  recent and remote memory, attention span and concentration, language, and fund of knowledge is normal.  Speech is not dysarthric.  CRANIAL NERVES: II:  No visual field defects.  Unremarkable fundi.   III-IV-VI: Pupils equal round and reactive to light.  Normal conjugate, extra-ocular eye movements in all directions of gaze.  No nystagmus.  No ptosis.   V:  Normal facial sensation.    VII:  Normal facial symmetry and movements.  No pathologic facial reflexes.  VIII:  Normal hearing and vestibular function.   IX-X:  Normal palatal movement.   XI:  Normal shoulder shrug and head rotation.   XII:  Normal tongue strength and range of motion, no  deviation or fasciculation.  MOTOR:  No atrophy, fasciculations or abnormal movements.  No pronator drift.  Tone is normal.    Right Upper Extremity:    Left Upper Extremity:    Deltoid  5/5   Deltoid  5/5   Biceps  5/5   Biceps  5/5   Triceps  5/5   Triceps  5/5   Wrist extensors  5/5   Wrist extensors  5/5   Wrist flexors  5/5   Wrist flexors  5/5   Finger extensors  5/5   Finger extensors  5/5   Finger flexors  5/5   Finger flexors  5/5   Dorsal interossei  5/5   Dorsal interossei  5/5   Abductor pollicis  5/5   Abductor pollicis  5/5   Tone (Ashworth scale)  0  Tone (Ashworth scale)  0   Right Lower Extremity:    Left Lower Extremity:    Hip flexors  5/5   Hip flexors  5/5   Hip extensors  5/5   Hip extensors  5/5   Knee flexors  5/5   Knee flexors  5/5   Knee extensors  5/5   Knee extensors  5/5   Dorsiflexors  5/5   Dorsiflexors  5/5   Plantarflexors  5/5   Plantarflexors  5/5   Toe extensors  5/5   Toe extensors  5/5   Toe flexors  5/5   Toe flexors  5/5   Tone (Ashworth scale)  0  Tone (Ashworth scale)  0   MSRs:  Right                                                                 Left brachioradialis 2+  brachioradialis 2+  biceps 2+  biceps 2+  triceps 2+  triceps 2+  patellar 2+  patellar 2+  ankle jerk 2+  ankle jerk 2+    Hoffman no  Hoffman no  plantar response down  plantar response down   SENSORY:  Reduced temperature and pin prick over the palmer and dorsal surface of the 4th digit on the right.  Sensation elsewhere is normal.  Tinel's is positive at the wrist bilaterally.    COORDINATION/GAIT: Normal finger-to- nose-finger and heel-to-shin.  Intact rapid alternating movements bilaterally.  Able to rise from a chair without using arms.  Gait narrow based and stable. Tandem and stressed gait intact.    IMPRESSION: 1.  Lateral epicondylitis on the right  - Referral to Dr. Katrinka Blazing for evaluation and management of lateral epicondylitis 2.  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  Bilateral carpal tunnel syndrome, asymptomatic  - No need for intervention for carpal tunnel syndrome, as he is asymptomatic  - Exam findings are not consistent with his current presentation as he does not have weakness or paresthesias over the median nerve distribution  - Precautions discussed to prevent worsening CTS.    Return to clinic if paresthesias worsens or he develops new weakness   The duration of this appointment  visit was 40 minutes of face-to-face time with the patient.  Greater than 50% of this time was spent in counseling, explanation of diagnosis, planning of further management, and coordination of care.   Thank you for allowing me to participate in patient's care.  If I can answer any additional questions, I would be pleased to do so.    Sincerely,    Laquandra Carrillo K. Posey Pronto, DO

## 2015-12-30 NOTE — Patient Instructions (Addendum)
1.  Referral to Dr. Katrinka BlazingSmith for evaluation and management of lateral epicondylitis 2.  Start using a soft elbow pad to avoid compression of the ulnar nerve and minimize overflexion 3.  Avoid over flexion at the wrist  Return to clinic if your numbness worsens or you develop new weakness

## 2016-01-16 ENCOUNTER — Encounter: Payer: Self-pay | Admitting: Family Medicine

## 2016-01-16 ENCOUNTER — Ambulatory Visit (INDEPENDENT_AMBULATORY_CARE_PROVIDER_SITE_OTHER): Payer: Managed Care, Other (non HMO) | Admitting: Family Medicine

## 2016-01-16 ENCOUNTER — Other Ambulatory Visit: Payer: Self-pay

## 2016-01-16 VITALS — BP 110/72 | HR 96 | Ht 71.0 in | Wt 204.0 lb

## 2016-01-16 DIAGNOSIS — M7711 Lateral epicondylitis, right elbow: Secondary | ICD-10-CM | POA: Diagnosis not present

## 2016-01-16 DIAGNOSIS — M25521 Pain in right elbow: Secondary | ICD-10-CM

## 2016-01-16 MED ORDER — DICLOFENAC SODIUM 2 % TD SOLN: 2.0000 "application " | Freq: Two times a day (BID) | TRANSDERMAL | Status: AC

## 2016-01-16 MED ORDER — NITROGLYCERIN 0.2 MG/HR TD PT24: MEDICATED_PATCH | TRANSDERMAL | Status: DC

## 2016-01-16 NOTE — Assessment & Plan Note (Signed)
Lateral Epicondylitis: Elbow anatomy was reviewed, and tendinopathy was explained.  Pt. given a formal rehab program. Series of concentric and eccentric exercises should be done starting with no weight, work up to 1 lb, hammer, etc.  Use counterforce strap if working or using hands.  Formal PT would be beneficial. Emphasized stretching an cross-friction massage Emphasized proper palms up lifting biomechanics to unload ECRB Wear wrist brace Pennsaid and nitro given today  If not better in 3 weeks will consider injection.

## 2016-01-16 NOTE — Progress Notes (Signed)
Steve Graham [address] [place], [place] [address] Phone: [phone] Subjective:    I'm seeing this patient by the request  of:  Patel MD  CC: Right elbow pain   BJY:NWGNFAOZHY Steve Graham is a 60 y.o. male coming in with complaint of right elbow pain.  Been many months, no true injury.  Localized on lateral aspect of the elbow. Worse with any movement, can wake him up at night. Mild weakness.  Padding has not helped. Not doing exercises. Patient has tried over-the-counter medications no significant improvement. Has tried a topical anti-inflammatory with no significant improvement. Seems that it is worsening over the course of time. Sometimes as related to some numbness. Also some weakness. Rates the severity pain of 8 out of 10        Past Medical History  Diagnosis Date  . Allergy   . Hyperlipidemia   . Hemorrhoids, external   . Hemorrhoids, internal   . Colon polyp 11/02/2007    sessile polyp, external hemorrhoids.  Repeat in 5-10 years. Hung.   Past Surgical History  Procedure Laterality Date  . Bunionectomy    . Colonoscopy w/ polypectomy  09/03/2008    colon polyps. repeat in 5 years. [address]   Social History   Social History  . Marital Status: Married    Spouse Name: N/A  . Number of Children: N/A  . Years of Education: N/A   Social History Main Topics  . Smoking status: Never Smoker   . Smokeless tobacco: Never Used  . Alcohol Use: 0.0 oz/week    0 Standard drinks or equivalent per week     Comment: Socially 1-2 times per month  . Drug Use: No  . Sexual Activity: Yes   Other Topics Concern  . None   Social History Narrative   Marital status: married x 23 years; happily married      Children:  2 boys; no grandchildren.      Lives: wife, 2 boys. 2 story home.      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       Education: college      Tobacco: never      Alcohol: two drinks per week.      Drugs: none      Exercises:   Two times per week for 30 minutes running, weight lifting.     Seatbelt:  100%      Guns: none            No Known Allergies Family History  Problem Relation Age of Onset  . Diabetes Mother   . Hypertension Mother   . Asthma Son   . Other Father     Deceased, gunshot wound  . Diabetes Sister     Past medical history, social, surgical and family history all reviewed in electronic medical record.  No pertanent information unless stated regarding to the chief complaint.   Review of Systems: No headache, visual changes, nausea, vomiting, diarrhea, constipation, dizziness, abdominal pain, skin rash, fevers, chills, night sweats, weight loss, swollen lymph nodes, body aches, joint swelling, muscle aches, chest pain, shortness of breath, mood changes.   Objective Blood pressure 110/72, pulse 96, height  (1.803 m), weight 204 lb (92.534 kg), SpO2 98 %.  General: No apparent distress alert and oriented x3 mood and affect normal, dressed appropriately.  HEENT: Pupils equal, extraocular movements intact  Respiratory: Patient's speak in full sentences and does not appear short of breath  Cardiovascular: No lower extremity edema, non tender, no erythema  Skin: Warm dry intact with no signs of infection or rash on extremities or on axial skeleton.  Abdomen: Soft nontender  Neuro: Cranial nerves II through XII are intact, neurovascularly intact in all extremities with 2+ DTRs and 2+ pulses.  Lymph: No lymphadenopathy of posterior or anterior cervical chain or axillae bilaterally.  Gait normal with good balance and coordination.  MSK:  Non tender with full range of motion and good stability and symmetric strength and tone of shoulders,  hip, knee and ankles bilaterally.  Elbow:Right Unremarkable to inspection. Range of motion full pronation, supination, flexion, extension. Strength is full to all of the above directions Stable to varus, valgus stress. Negative moving valgus stress  test. Severe tenderness to palpation over the lateral epicondylar region worse with resisted extension of the middle finger Ulnar nerve does not sublux. Negative cubital tunnel Tinel's. Contralateral elbow unremarkable  Musculoskeletal ultrasound was performed and interpreted by Terrilee FilesZach Smith D.O.   Elbow:  Lateral epicondyle and common extensor tendon origin visualized. Significant increase in hypoechoic changes and increasing Doppler flow. Appears to have intersubstance tearing. Mild retraction on the articular side. Radial head unremarkable and located in annular ligament Medial epicondyle and common flexor tendon origin visualized.  No edema, effusions, or avulsions seen. Marland Kitchen.   IMPRESSION:  Lateral epicondylitis with intersubstance tearing     Impression and Recommendations:     This case required medical decision making of moderate complexity.      Note: This dictation was prepared with Dragon dictation along with smaller phrase technology. Any transcriptional errors that result from this process are unintentional.

## 2016-01-16 NOTE — Patient Instructions (Addendum)
Good to see you.  Ice 20 minutes 2 times daily. Usually after activity and before bed. Wear brace day and night  For a week then nightly for 2 weeks.  pennsaid pinkie amount topically 2 times daily as needed.  Nitroglycerin Protocol   Apply 1/4 nitroglycerin patch to affected area daily.  Change position of patch within the affected area every 24 hours.  You may experience a headache during the first 1-2 weeks of using the patch, these should subside.  If you experience headaches after beginning nitroglycerin patch treatment, you may take your preferred over the counter pain reliever.  Another side effect of the nitroglycerin patch is skin irritation or rash related to patch adhesive.  Please notify our office if you develop more severe headaches or rash, and stop the patch.  Tendon healing with nitroglycerin patch may require 12 to 24 weeks depending on the extent of injury.  Men should not use if taking Viagra, Cialis, or Levitra.   Do not use if you have migraines or rosacea.   Exercises 3 times a week starting in a week See em again in 3 weeks and if not better we will do an injection at that time.

## 2016-01-16 NOTE — Progress Notes (Signed)
Pre visit review using our clinic review tool, if applicable. No additional management support is needed unless otherwise documented below in the visit note. 

## 2016-02-07 ENCOUNTER — Ambulatory Visit (INDEPENDENT_AMBULATORY_CARE_PROVIDER_SITE_OTHER): Payer: Managed Care, Other (non HMO) | Admitting: Family Medicine

## 2016-02-07 ENCOUNTER — Encounter: Payer: Self-pay | Admitting: Family Medicine

## 2016-02-07 VITALS — BP 130/78 | HR 66 | Ht 71.0 in | Wt 209.0 lb

## 2016-02-07 DIAGNOSIS — M7711 Lateral epicondylitis, right elbow: Secondary | ICD-10-CM

## 2016-02-07 NOTE — Patient Instructions (Signed)
Good to see you  Ice is your friend  Wear the brace less and less during the day but still would do it at night for another 1-2 weeks.  For the patch continue it daily for 2 weeks then only 3 times a week for 3 weeks then discontinue.   If the pain gets worse then go back up.  OK for you to start working out again but only 50% of what you were doing. Increase 10% a weeks.  See me again in 6 weeks if not perfect  Good job!

## 2016-02-07 NOTE — Progress Notes (Signed)
Pre visit review using our clinic review tool, if applicable. No additional management support is needed unless otherwise documented below in the visit note. 

## 2016-02-07 NOTE — Assessment & Plan Note (Signed)
Significant improvement on the nitroglycerin patches at this time. We will do a six-week taper at this moment. We discussed icing regimen. Patient continue with the exercises. Patient will slowly come out of the brace on a more regular basis. Patient come back and see me again in 6 weeks for further evaluation and treatment.

## 2016-02-07 NOTE — Progress Notes (Signed)
[facility] [address] [place], [place] [address] Phone: [phone] Subjective:    I'm seeing this patient by the request  of:  Patel MD  CC: Right elbow pain   BJY:NWGNFAOZHY Steve Graham is a 60 y.o. male coming in with complaint of right elbow pain.  Patient did have a intersubstance tear of the common extensor tendon. Has been doing the nitroglycerin and bracing. States that he is feeling 80-85% better. States that the swelling is down and is having improved strength. Patient daily activities without any significant pain. Continues to wear the brace though on a daily basis. No new symptoms.     Past Medical History  Diagnosis Date  . Allergy   . Hyperlipidemia   . Hemorrhoids, external   . Hemorrhoids, internal   . Colon polyp 11/02/2007    sessile polyp, external hemorrhoids.  Repeat in 5-10 years. Hung.   Past Surgical History  Procedure Laterality Date  . Bunionectomy    . Colonoscopy w/ polypectomy  09/03/2008    colon polyps. repeat in 5 years. [address]   Social History   Social History  . Marital Status: Married    Spouse Name: N/A  . Number of Children: N/A  . Years of Education: N/A   Social History Main Topics  . Smoking status: Never Smoker   . Smokeless tobacco: Never Used  . Alcohol Use: 0.0 oz/week    0 Standard drinks or equivalent per week     Comment: Socially 1-2 times per month  . Drug Use: No  . Sexual Activity: Yes   Other Topics Concern  . None   Social History Narrative   Marital status: married x 23 years; happily married      Children:  2 boys; no grandchildren.      Lives: wife, 2 boys. 2 story home.      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       Education: college      Tobacco: never      Alcohol: two drinks per week.      Drugs: none      Exercises:  Two times per week for 30 minutes running, weight lifting.     Seatbelt:  100%      Guns: none            No Known Allergies Family  History  Problem Relation Age of Onset  . Diabetes Mother   . Hypertension Mother   . Asthma Son   . Other Father     Deceased, gunshot wound  . Diabetes Sister     Past medical history, social, surgical and family history all reviewed in electronic medical record.  No pertanent information unless stated regarding to the chief complaint.   Review of Systems: No headache, visual changes, nausea, vomiting, diarrhea, constipation, dizziness, abdominal pain, skin rash, fevers, chills, night sweats, weight loss, swollen lymph nodes, body aches, joint swelling, muscle aches, chest pain, shortness of breath, mood changes.   Objective Blood pressure 130/78, pulse 66, height  (1.803 m), weight 209 lb (94.802 kg), SpO2 97 %.  General: No apparent distress alert and oriented x3 mood and affect normal, dressed appropriately.  HEENT: Pupils equal, extraocular movements intact  Respiratory: Patient's speak in full sentences and does not appear short of breath  Cardiovascular: No lower extremity edema, non tender, no erythema  Skin: Warm dry intact with no signs of infection or rash on extremities or on  axial skeleton.  Abdomen: Soft nontender  Neuro: Cranial nerves II through XII are intact, neurovascularly intact in all extremities with 2+ DTRs and 2+ pulses.  Lymph: No lymphadenopathy of posterior or anterior cervical chain or axillae bilaterally.  Gait normal with good balance and coordination.  MSK:  Non tender with full range of motion and good stability and symmetric strength and tone of shoulders,  hip, knee and ankles bilaterally.  Elbow:Right Unremarkable to inspection. Range of motion full pronation, supination, flexion, extension. Strength is full to all of the above directions Stable to varus, valgus stress. Negative moving valgus stress test. Minimal tenderness over the lateral epicondylar region. Significant improvement from previous exam. Negative cubital tunnel  Tinel's. Contralateral elbow unremarkable  Musculoskeletal ultrasound was performed and interpreted by Terrilee FilesZach Dalayna Lauter D.O.   Elbow:  Lateral epicondyle and common extensor tendon origin visualized. Patient is so healing of the intersubstance tearing noted. Patient still has increasing Doppler flow in neovascularization. Significant decrease in hypoechoic changes   IMPRESSION: Healing lateral epicondylitis     Impression and Recommendations:     This case required medical decision making of moderate complexity.      Note: This dictation was prepared with Dragon dictation along with smaller phrase technology. Any transcriptional errors that result from this process are unintentional.

## 2016-03-13 ENCOUNTER — Ambulatory Visit: Payer: Managed Care, Other (non HMO) | Admitting: Family Medicine

## 2016-05-16 ENCOUNTER — Ambulatory Visit (INDEPENDENT_AMBULATORY_CARE_PROVIDER_SITE_OTHER): Payer: Managed Care, Other (non HMO) | Admitting: Physician Assistant

## 2016-05-16 VITALS — BP 132/80 | HR 68 | Temp 98.0°F | Resp 16 | Ht 70.0 in | Wt 213.0 lb

## 2016-05-16 DIAGNOSIS — R42 Dizziness and giddiness: Secondary | ICD-10-CM | POA: Diagnosis not present

## 2016-05-16 DIAGNOSIS — Z23 Encounter for immunization: Secondary | ICD-10-CM | POA: Diagnosis not present

## 2016-05-16 DIAGNOSIS — E785 Hyperlipidemia, unspecified: Secondary | ICD-10-CM | POA: Diagnosis not present

## 2016-05-16 DIAGNOSIS — Z0000 Encounter for general adult medical examination without abnormal findings: Secondary | ICD-10-CM

## 2016-05-16 LAB — COMPLETE METABOLIC PANEL WITH GFR
Albumin: 4.3 g/dL (ref 3.6–5.1)
Alkaline Phosphatase: 54 U/L (ref 40–115)
CO2: 24 mmol/L (ref 20–31)
GFR, Est Non African American: 83 mL/min (ref >=60)
Glucose, Bld: 88 mg/dL (ref 65–99)
Potassium: 4.2 mmol/L (ref 3.5–5.3)
Sodium: 137 mmol/L (ref 135–146)

## 2016-05-16 LAB — LIPID PANEL
Cholesterol: 205 mg/dL — ABNORMAL HIGH (ref 125–200)
HDL: 46 mg/dL (ref >=40)
LDL Cholesterol: 141 mg/dL — ABNORMAL HIGH (ref <130)
Total CHOL/HDL Ratio: 4.5 Ratio (ref <=5.0)
Triglycerides: 89 mg/dL (ref <150)
VLDL: 18 mg/dL (ref <30)

## 2016-05-16 LAB — COMPLETE METABOLIC PANEL WITHOUT GFR
ALT: 25 U/L (ref 9–46)
AST: 21 U/L (ref 10–35)
BUN: 17 mg/dL (ref 7–25)
Calcium: 9.2 mg/dL (ref 8.6–10.3)
Chloride: 104 mmol/L (ref 98–110)
Creat: 0.98 mg/dL (ref 0.70–1.25)
GFR, Est African American: >89 mL/min (ref >=60)
Total Bilirubin: 0.8 mg/dL (ref 0.2–1.2)
Total Protein: 7.3 g/dL (ref 6.1–8.1)

## 2016-05-16 MED ORDER — ZOSTER VACCINE LIVE 19400 UNT/0.65ML ~~LOC~~ SUSR: 0.6500 mL | Freq: Once | SUBCUTANEOUS | 0 refills | Status: AC

## 2016-05-16 MED ORDER — ZOSTER VACCINE LIVE 19400 UNT/0.65ML ~~LOC~~ SUSR: 0.6500 mL | Freq: Once | SUBCUTANEOUS | 0 refills | Status: DC

## 2016-05-16 NOTE — Patient Instructions (Addendum)
Please go to your pharmacy for your Shingles vaccination.  I will contact you with your lipid results.    IF you received an x-ray today, you will receive an invoice from [facility]. Please contact [facility] at [phone] with questions or concerns regarding your invoice.   IF you received labwork today, you will receive an invoice from [employer]. Please contact [facility] at [phone] with questions or concerns regarding your invoice.   Our billing staff will not be able to assist you with questions regarding bills from these companies.  You will be contacted with the lab results as soon as they are available. The fastest way to get your results is to activate your My Chart account. Instructions are located on the last page of this paperwork. If you have not heard from [place] regarding the results in 2 weeks, please contact this office.     Fat and Cholesterol Restricted Diet High levels of fat and cholesterol in your blood may lead to various health problems, such as diseases of the heart, blood vessels, gallbladder, liver, and pancreas. Fats are concentrated sources of energy that come in various forms. Certain types of fat, including saturated fat, may be harmful in excess. Cholesterol is a substance needed by your body in small amounts. Your body makes all the cholesterol it needs. Excess cholesterol comes from the food you eat. When you have high levels of cholesterol and saturated fat in your blood, health problems can develop because the excess fat and cholesterol will gather along the walls of your blood vessels, causing them to narrow. Choosing the right foods will help you control your intake of fat and cholesterol. This will help keep the levels of these substances in your blood within normal limits and reduce your risk of disease. WHAT IS MY PLAN? Your health care provider recommends that you:  Get no more than __________ % of the total  calories in your daily diet from fat.  Limit your intake of saturated fat to less than ______% of your total calories each day.  Limit the amount of cholesterol in your diet to less than _________mg per day. WHAT TYPES OF FAT SHOULD I CHOOSE?  Choose healthy fats more often. Choose monounsaturated and polyunsaturated fats, such as olive and canola oil, flaxseeds, walnuts, almonds, and seeds.  Eat more omega-3 fats. Good choices include salmon, mackerel, sardines, tuna, flaxseed oil, and ground flaxseeds. Aim to eat fish at least two times a week.  Limit saturated fats. Saturated fats are primarily found in animal products, such as meats, butter, and cream. Plant sources of saturated fats include palm oil, palm kernel oil, and coconut oil.  Avoid foods with partially hydrogenated oils in them. These contain trans fats. Examples of foods that contain trans fats are stick margarine, some tub margarines, cookies, crackers, and other baked goods. WHAT GENERAL GUIDELINES DO I NEED TO FOLLOW? These guidelines for healthy eating will help you control your intake of fat and cholesterol:  Check food labels carefully to identify foods with trans fats or high amounts of saturated fat.  Fill one half of your plate with vegetables and green salads.  Fill one fourth of your plate with whole grains. Look for the word "whole" as the first word in the ingredient list.  Fill one fourth of your plate with lean protein foods.  Limit fruit to two servings a day. Choose fruit instead of juice.  Eat more foods that contain soluble fiber. Examples of foods  that contain this type of fiber are apples, broccoli, carrots, beans, peas, and barley. Aim to get 20-30 g of fiber per day.  Eat more home-cooked food and less restaurant, buffet, and fast food.  Limit or avoid alcohol.  Limit foods high in starch and sugar.  Limit fried foods.  Cook foods using methods other than frying. Baking, boiling, grilling,  and broiling are all great options.  Lose weight if you are overweight. Losing just 5-10% of your initial body weight can help your overall health and prevent diseases such as diabetes and heart disease. WHAT FOODS CAN I EAT? Grains Whole grains, such as whole wheat or whole grain breads, crackers, cereals, and pasta. Unsweetened oatmeal, bulgur, barley, quinoa, or brown rice. Corn or whole wheat flour tortillas. Vegetables Fresh or frozen vegetables (raw, steamed, roasted, or grilled). Green salads. Fruits All fresh, canned (in natural juice), or frozen fruits. Meat and Other Protein Products Ground beef (85% or leaner), grass-fed beef, or beef trimmed of fat. Skinless chicken or [place]. Ground chicken or [place]. Pork trimmed of fat. All fish and seafood. Eggs. Dried beans, peas, or lentils. Unsalted nuts or seeds. Unsalted canned or dry beans. Dairy Low-fat dairy products, such as skim or 1% milk, 2% or reduced-fat cheeses, low-fat ricotta or cottage cheese, or plain low-fat yogurt. Fats and Oils Tub margarines without trans fats. Light or reduced-fat mayonnaise and salad dressings. Avocado. Olive, canola, sesame, or safflower oils. Natural peanut or almond butter (choose ones without added sugar and oil). The items listed above may not be a complete list of recommended foods or beverages. Contact your dietitian for more options. WHAT FOODS ARE NOT RECOMMENDED? Grains White bread. White pasta. White rice. Cornbread. Bagels, pastries, and croissants. Crackers that contain trans fat. Vegetables White potatoes. Corn. Creamed or fried vegetables. Vegetables in a cheese sauce. Fruits Dried fruits. Canned fruit in light or heavy syrup. Fruit juice. Meat and Other Protein Products Fatty cuts of meat. Ribs, chicken wings, bacon, sausage, bologna, salami, chitterlings, fatback, hot dogs, bratwurst, and packaged luncheon meats. Liver and organ meats. Dairy Whole or 2% milk, cream, half-and-half,  and cream cheese. Whole milk cheeses. Whole-fat or sweetened yogurt. Full-fat cheeses. Nondairy creamers and whipped toppings. Processed cheese, cheese spreads, or cheese curds. Sweets and Desserts Corn syrup, sugars, honey, and molasses. Candy. Jam and jelly. Syrup. Sweetened cereals. Cookies, pies, cakes, donuts, muffins, and ice cream. Fats and Oils Butter, stick margarine, lard, shortening, ghee, or bacon fat. Coconut, palm kernel, or palm oils. Beverages Alcohol. Sweetened drinks (such as sodas, lemonade, and fruit drinks or punches). The items listed above may not be a complete list of foods and beverages to avoid. Contact your dietitian for more information.   This information is not intended to replace advice given to you by your health care provider. Make sure you discuss any questions you have with your health care provider.   Document Released: 08/20/2005 Document Revised: 09/10/2014 Document Reviewed: 11/18/2013 Elsevier Interactive Patient Education [employer]2016 Elsevier Inc.

## 2016-05-16 NOTE — Progress Notes (Signed)
[facility] [address], [place] [address] [phone]  Date:  05/16/2016   Name:  Steve Graham   DOB:  Aug 24, 1956   MRN:  [id number]  PCP:  No primary care provider on file.    Chief Complaint: Annual Exam   History of Present Illness:  This is a 60 y.o. male with PMH hyperlipidemia who is presenting for CPE. Feeling well  Last annual physical one year ago.   Was taking cholesterol medication but developed joint pains. Stopped taking about a year and a half ago.   Complaints:  Dizziness. One episode of dizziness last week after he stood from sitting position. He was worked up last year for dizziness: EKG, TSH, Echocardiogram.  EKG: No changes from his previous. Echo result: LV EF 60-65% no significant valvular abnormalities.  TSH: Within normal limits.  Immunizations: Not UTD Zostavax Dentist: July 2017, Dr. Nedra HaiLee Eye: Eye exam one moth ago. Wears glasses.  Diet: Cut down on pork. Fast food twice a week. Oat meal every morning.   Daily vitamin Exercise: Runs, weight lifting, basketball. three times a week Sexual hx: Wife x 25 years.  Urinary hesitancy/frequency or nocturia: No Problems with erectile dysfunction: No  Tobacco/alcohol/substance use: No  Colonoscopy: Last imaging 2013. Needs to schedule appointment  Decline flu shot today. Would like Zostavax.   Review of Systems:  Review of Systems  Respiratory: Negative.   Cardiovascular: Negative.   Gastrointestinal: Negative.   Genitourinary: Negative.   Neurological: Positive for dizziness.    Patient Active Problem List   Diagnosis Date Noted  . Right lateral epicondylitis 01/16/2016  . Hyperlipidemia 04/17/2015  . Internal and external hemorrhoids without complication 01/27/2013    Prior to Admission medications   Medication Sig Start Date End Date Taking? Authorizing Provider  aspirin 81 MG tablet Take 81 mg by mouth daily.   Yes Historical Provider, MD  co-enzyme Q-10 30 MG capsule  Take 30 mg by mouth 3 (three) times daily.   Yes Historical Provider, MD  cycloSPORINE (RESTASIS) 0.05 % ophthalmic emulsion 1 drop 2 (two) times daily.   Yes Historical Provider, MD  Diclofenac Sodium (PENNSAID) 2 % SOLN Place 2 application onto the skin 2 (two) times daily. 01/16/16  Yes Judi SaaZachary M Smith, DO  Multiple Vitamins-Minerals (MULTIVITAMIN WITH MINERALS) tablet Take 1 tablet by mouth daily.   Yes Historical Provider, MD    No Known Allergies  Past Surgical History:  Procedure Laterality Date  . BUNIONECTOMY    . COLONOSCOPY W/ POLYPECTOMY  09/03/2008   colon polyps. repeat in 5 years. [facility] [address]    Social History  Substance Use Topics  . Smoking status: Never Smoker  . Smokeless tobacco: Never Used  . Alcohol use 0.0 oz/week     Comment: Socially 1-2 times per month    Family History  Problem Relation Age of Onset  . Diabetes Mother   . Hypertension Mother   . Asthma Son   . Other Father     Deceased, gunshot wound  . Diabetes Sister     Medication list has been reviewed and updated.  Physical Examination:  Physical Exam  Constitutional: He is oriented to person, place, and time. Vital signs are normal. He appears well-developed and well-nourished. No distress.  HENT:  Head: Normocephalic and atraumatic.  Right Ear: External ear normal.  Left Ear: External ear normal.  Mouth/Throat: Oropharynx is clear and moist.  Eyes: Conjunctivae are normal. Pupils are equal, round, and  reactive to light.  Cardiovascular: Normal rate, regular rhythm and normal heart sounds.   Pulmonary/Chest: Effort normal and breath sounds normal. No respiratory distress. He has no wheezes. He has no rales.  Abdominal: Soft. Bowel sounds are normal. He exhibits no distension and no mass. There is no tenderness.  Neurological: He is alert and oriented to person, place, and time. He has normal strength and normal reflexes. No cranial nerve deficit or sensory deficit. GCS eye subscore is 4. GCS  verbal subscore is 5. GCS motor subscore is 6.  Skin: Skin is warm and dry.  Psychiatric: He has a normal mood and affect. His behavior is normal. Judgment and thought content normal.    BP 132/80 (BP Location: Left Arm, Patient Position: Sitting, Cuff Size: Large)   Pulse 68   Temp 98 F (36.7 C) (Oral)   Resp 16   Ht 5\' 10"  (1.778 m)   Wt 213 lb (96.6 kg)   SpO2 98%   BMI 30.56 kg/m   Echo 05/18/2015: Normal LV size and Systolic function. EF 60-65%. No valvular abnormalities.   Assessment and Plan: 1. Annual physical exam - Normal exam and vitals.  - POCT CBC - COMPLETE METABOLIC PANEL WITH GFR  2. Dizziness -  Dizziness episode likely due to standing up too quickly. Discussed with patient importance of staying well hydrated and avoid changing position too quickly. 2016 Echo showed EF 60-65%. RTC if dizziness returns or gets worse.   3. Hyperlipemia - Checking Lipid panel today.  - Will contact patient with plan for lipid control when resulted. Lipitor caused him joint pain. Will possibly start Pravastatin.   4. Need for vaccination for zoster - Zoster Vaccine Live, PF, (ZOSTAVAX) 21308 UNT/0.65ML injection; Inject 19,400 Units into the skin once.  Dispense: 1 each; Refill: 0

## 2016-05-22 ENCOUNTER — Telehealth: Payer: Self-pay

## 2016-05-22 ENCOUNTER — Encounter: Payer: Self-pay | Admitting: Physician Assistant

## 2016-05-22 NOTE — Telephone Encounter (Signed)
Patient stated that he was under the impression that [facility] was going to call him something in for his cholesterol but he hasnt gotten anything so can someone call and advise him what if anything he should be taking for this.

## 2016-05-23 ENCOUNTER — Other Ambulatory Visit: Payer: Self-pay | Admitting: Physician Assistant

## 2016-05-23 ENCOUNTER — Encounter: Payer: Self-pay | Admitting: Physician Assistant

## 2016-05-23 DIAGNOSIS — E785 Hyperlipidemia, unspecified: Secondary | ICD-10-CM

## 2016-05-23 DIAGNOSIS — Z1159 Encounter for screening for other viral diseases: Secondary | ICD-10-CM

## 2016-05-23 MED ORDER — PRAVASTATIN SODIUM 20 MG PO TABS: 20.0000 mg | ORAL_TABLET | Freq: Every day | ORAL | 0 refills | Status: AC

## 2016-05-23 NOTE — Progress Notes (Signed)
Lipid panel shows elevated cholesterol. According to 10-year ASCVD risk score of 8.9%, he should be on statin therapy. Will start 20mg  Pravastatin daily. Follow-up in one month to check lipid levels and assess how he tolerates his therapy. He developed joint pain with Lipitor.

## 2016-05-30 ENCOUNTER — Ambulatory Visit (INDEPENDENT_AMBULATORY_CARE_PROVIDER_SITE_OTHER): Payer: Managed Care, Other (non HMO) | Admitting: Physician Assistant

## 2016-05-30 VITALS — BP 140/90 | HR 68 | Temp 98.4°F | Resp 17 | Ht 70.0 in | Wt 213.0 lb

## 2016-05-30 DIAGNOSIS — Z1159 Encounter for screening for other viral diseases: Secondary | ICD-10-CM | POA: Diagnosis not present

## 2016-05-30 LAB — HEPATITIS C ANTIBODY: HCV AB: NEGATIVE

## 2016-05-30 NOTE — Progress Notes (Signed)
   Steve StandardVental Rogstad  MRN: [id number] DOB: 01-12-1956  Subjective:  Steve Graham is a 60 y.o. male seen in office today for a chief complaint of hepatitis c screen lab work. He was seen on 05/16/16 for complete physical exam. Pt has no other concerns or complaints today.   Review of Systems  Gastrointestinal: Negative for abdominal pain, diarrhea, nausea and vomiting.    Patient Active Problem List   Diagnosis Date Noted  . Right lateral epicondylitis 01/16/2016  . Hyperlipidemia 04/17/2015  . Internal and external hemorrhoids without complication 01/27/2013    Current Outpatient Prescriptions on File Prior to Visit  Medication Sig Dispense Refill  . aspirin 81 MG tablet Take 81 mg by mouth daily.    Marland Kitchen. co-enzyme Q-10 30 MG capsule Take 30 mg by mouth 3 (three) times daily.    . cycloSPORINE (RESTASIS) 0.05 % ophthalmic emulsion 1 drop 2 (two) times daily.    . Diclofenac Sodium (PENNSAID) 2 % SOLN Place 2 application onto the skin 2 (two) times daily. 112 g 3  . Multiple Vitamins-Minerals (MULTIVITAMIN WITH MINERALS) tablet Take 1 tablet by mouth daily.    . pravastatin (PRAVACHOL) 20 MG tablet Take 1 tablet (20 mg total) by mouth daily. 90 tablet 0   No current facility-administered medications on file prior to visit.     No Known Allergies  Objective:  BP 140/90 (BP Location: Right Arm, Patient Position: Sitting, Cuff Size: Large)   Pulse 68   Temp 98.4 F (36.9 C) (Oral)   Resp 17   Ht 5\' 10"  (1.778 m)   Wt 213 lb (96.6 kg)   SpO2 98%   BMI 30.56 kg/m   Physical Exam  Constitutional: He is oriented to person, place, and time and well-developed, well-nourished, and in no distress.  HENT:  Head: Normocephalic and atraumatic.  Eyes: Conjunctivae are normal.  Neck: Normal range of motion.  Cardiovascular: Normal rate, regular rhythm and normal heart sounds.   Pulmonary/Chest: Effort normal.  Neurological: He is alert and oriented to person, place, and time. Gait normal.    Skin: Skin is warm and dry.  Psychiatric: Affect normal.  Vitals reviewed.    Assessment and Plan :   1. Need for hepatitis C screening test - Hepatitis C antibody    Benjiman CoreBrittany Caidyn Blossom PA-C  [facility] 05/30/2016 8:32 AM

## 2016-05-30 NOTE — Patient Instructions (Addendum)
  Thank you for letting me participate in your health and well being.  We will let you know your results via mychart.   IF you received an x-ray today, you will receive an invoice [facility]. Please contact [facility] at [phone] with questions or concerns regarding your invoice.   IF you received labwork today, you will receive an invoice from [facility]. Please contact [facility] at [phone] with questions or concerns regarding your invoice.   Our billing staff will not be able to assist you with questions regarding bills from these companies.  You will be contacted with the lab results as soon as they are available. The fastest way to get your results is to activate your My Chart account. Instructions are located on the last page of this paperwork. If you have not heard from us regarding the results in 2 weeks, please contact this office.

## 2016-09-02 ENCOUNTER — Other Ambulatory Visit: Payer: Self-pay | Admitting: Physician Assistant

## 2016-09-02 DIAGNOSIS — E785 Hyperlipidemia, unspecified: Secondary | ICD-10-CM

## 2017-02-25 ENCOUNTER — Other Ambulatory Visit: Payer: Self-pay | Admitting: Physician Assistant

## 2017-02-25 DIAGNOSIS — E785 Hyperlipidemia, unspecified: Secondary | ICD-10-CM

## 2024-03-20 ENCOUNTER — Encounter: Payer: Self-pay | Admitting: Advanced Practice Midwife

## 2024-09-02 ENCOUNTER — Telehealth: Payer: Self-pay

## 2024-09-02 NOTE — Telephone Encounter (Signed)
 Copied from CRM #[id number]. Topic: General - Other >> Sep 01, 2024 11:02 AM Victoria A wrote: Reason for CRM: Patient said that he would like a call letting him know if in his chart stating that he has had Ulcers before-call back number is Mobile [phone]

## 2024-09-02 NOTE — Telephone Encounter (Signed)
"  Wrong office  "
